# Patient Record
Sex: Male | Born: 1960 | Race: White | Marital: Married | Smoking: Never smoker
Health system: Northeastern US, Academic
[De-identification: ages and names within clinical notes are randomized; demographics above are authoritative.]

## PROBLEM LIST (undated history)

## (undated) DIAGNOSIS — J189 Pneumonia, unspecified organism: Secondary | ICD-10-CM

## (undated) DIAGNOSIS — E785 Hyperlipidemia, unspecified: Secondary | ICD-10-CM

## (undated) DIAGNOSIS — S82899A Other fracture of unspecified lower leg, initial encounter for closed fracture: Secondary | ICD-10-CM

## (undated) HISTORY — PX: HX TONSILLECTOMY/ADENOIDECTOMY: SHX292

## (undated) HISTORY — DX: Hyperlipidemia, unspecified: E78.5

## (undated) HISTORY — DX: Other fracture of unspecified lower leg, initial encounter for closed fracture: S82.899A

## (undated) HISTORY — DX: Pneumonia, unspecified organism: J18.9

---

## 2006-02-27 DIAGNOSIS — G43909 Migraine, unspecified, not intractable, without status migrainosus: Secondary | ICD-10-CM | POA: Insufficient documentation

## 2006-08-05 DIAGNOSIS — I781 Nevus, non-neoplastic: Secondary | ICD-10-CM | POA: Insufficient documentation

## 2006-08-05 DIAGNOSIS — I10 Essential (primary) hypertension: Secondary | ICD-10-CM | POA: Insufficient documentation

## 2009-08-11 ENCOUNTER — Ambulatory Visit
Admit: 2009-08-11 | Discharge: 2009-08-11 | Disposition: A | Discharge status: Home / Self Care | Payer: Self-pay | Source: Ambulatory Visit | Attending: Primary Care | Admitting: Primary Care

## 2009-09-06 ENCOUNTER — Ambulatory Visit: Payer: Self-pay | Admitting: Sports Medicine

## 2009-09-07 ENCOUNTER — Other Ambulatory Visit: Payer: Self-pay | Admitting: Sports Medicine

## 2009-09-20 ENCOUNTER — Ambulatory Visit: Payer: Self-pay | Admitting: Sports Medicine

## 2009-09-20 DIAGNOSIS — S83289A Other tear of lateral meniscus, current injury, unspecified knee, initial encounter: Secondary | ICD-10-CM | POA: Insufficient documentation

## 2009-09-27 NOTE — Progress Notes (Signed)
 HISTORY OF PRESENT ILLNESS:  Mendell is seen in follow-up for examination of  right knee and to review MRI findings.  Describes primarily a  posterior-based pain slightly lateral to midline and pain is present with  walking and present after standing after a position for a long time.    PHYSICAL EXAMINATION:  Examination demonstrates pain is present with deep  flexion, posterior based.  He has discomfort with circumduction testing,  again posterior based pain.  No appreciable effusion.  Ligamentously  stable.    IMAGING:  MRI demonstrates that there is a small lateral meniscus root tear  and a possible medial meniscus tear posteriorly as well.  No obvious  arthrosis.    ASSESSMENT:  Possible small meniscus tear.  The radiologic interpretation  has been negative for meniscus tear.  He does have what appears to be  slight irregularity certainly at the posterior lateral root attachment.  He  has not yet failed conservative management, however, and I do not think  arthroscopy would be appropriate as a first-line treatment.  We discussed  the options including a stability program with strengthening of quadriceps  and hamstrings.  He will work on range of motion and flexibility.  His  desire is to potentially progress back to a running type of program.  We  will work toward that goal.  In an effort to allow him to participate more  aggressively at therapy, I have discussed corticosteroid injection.  He  agrees to that, and after appropriate sterile prep, a combination of 80 mg  of Depo-Medrol with 3 mL of 1% lidocaine are instilled into the knee  without difficulty.  Follow up in 6 to 8 weeks.  He will work with Therapy  in the interim.                Electronically Signed and Finalized  by  Verita Schneiders, MD 09/27/2009 07:07  ___________________________________________  Verita Schneiders, MD      DD:   09/21/2009  DT:   09/21/2009  4:12 P  ZOX/WR#[id number]  [id number]    cc:

## 2009-11-16 ENCOUNTER — Ambulatory Visit: Payer: Self-pay | Admitting: Sports Medicine

## 2009-11-19 NOTE — Progress Notes (Signed)
 [name] [name] is seen in follow-up for reexamination of his right knee.  MRI  demonstrates a small lateral meniscus root tear and possible medial  meniscus posterior tear as well.  Recent corticosteroid injection, and he  is working in therapy.  Overall does demonstrate improvement.  Not  currently interested in surgery.    PHYSICAL EXAMINATION:  Examination demonstrates range is 0 to 130.  He does  have posterior based pain on deep flexion and circumduction, and this is  primarily a lateral posterior pain.  Ligamentously intact.    ASSESSMENT:  Lateral meniscus root ear, possible medial meniscus tear.  Improved with current intervention with steroid and therapy.  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.  If symptoms return or worsen,  suggest repeat examination in the office.  Could certainly consider an  arthroscopy.  I will await to hear back from [name].                Electronically Signed and Finalized  by  [name] Schneiders, MD 11/19/2009 12:34  ___________________________________________  [name] Schneiders, MD      DD:   11/18/2009  DT:   11/18/2009 10:08 A  ZOX/WR#[id number]  [id number]    cc:

## 2009-11-29 ENCOUNTER — Encounter: Payer: Self-pay | Admitting: Gastroenterology

## 2009-11-29 ENCOUNTER — Ambulatory Visit: Payer: Self-pay | Admitting: Primary Care

## 2009-11-29 NOTE — Progress Notes (Signed)
 Reason For Visit   HTN.  HPI   HYPERTENSION MANAGEMENT: [name] [name] is here for hypertension follow-up.  SYMPTOMS:  episodic sharp left > right chest pains since last summer which   is less now.  No pain with treadmill/exercise bike  PATIENT DENIES: Headache,  Palpitations, Shortness of Breath   HABITS:   --Patient has been following a reduced sodium diet.  --He is not getting adequate exercise due to recent small right lateral and   possible medial meniscus tear but he recently did PT which helped.  He just   resumed treadmill at home  --Smoking: No.   --Caffeine Use:  Yes.   4-5 diet Pepsi/d  HOME BLOOD PRESSURE: Does not take.    MEDICATIONS: None      ** Medication reconciliation completed and updated.  Allergies   No Known Drug Allergy.  Current Meds   None.  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  Vital Signs   Recorded by [facility] on 29 Nov 2009 07:58 AM  BP:122/82,  LUE,  Sitting,   HR: 68 b/min,  L Radial,   Weight: 213.4 lb.  Physical Exam   HEART: Regular rate and rhythm, no murmurs gallops or rubs.  LUNGS: Clear to auscultation bilaterally.     Assessment/Plan:     1.  HTN: Presently stable on no medicine.  Continue diet and exercise     RETURN: 4 months HTN.  Signature   Electronically signed by: Jasper Loser  M.D.; 11/29/2009 8:25 AM EST.

## 2010-03-21 ENCOUNTER — Ambulatory Visit: Payer: Self-pay | Admitting: Primary Care

## 2010-03-21 NOTE — Progress Notes (Signed)
Reason For Visit   HTN.  HPI   HYPERTENSION MANAGEMENT: [name] [name] is here for hypertension follow-up.  SYMPTOMS: Chest pain-occasional chronic sharp on right side-no trigger.  It   resolves on its own    PATIENT DENIES: Headache,   Palpitations, Shortness of Breath   HABITS:   --Patient has been following a reduced sodium diet.  --He is getting adequate exercise-walks 3-4x a week  --Smoking: No.   --Caffeine Use:  Yes.   4-5 diet Pepsi/d  HOME BLOOD PRESSURE: Does not take.    MEDICATIONS: None      ** Medication reconciliation completed and updated.  Allergies   No Known Drug Allergy.  Current Meds   None.  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  Vital Signs   Recorded by [facility] on 21 Mar 2010 07:54 AM  BP:116/82,  LUE,  Sitting,   HR: 70 b/min,  L Radial, Regular,   Weight: 206.6 lb.  Physical Exam   HEART: Regular rate and rhythm, no murmurs gallops or rubs.  LUNGS: Clear to auscultation bilaterally.  CHEST:  No palpable tenderness  .  Assessment   Assessment/Plan:     1. HYPERTENSION  --According to JNC 7 guidelines target BP: less than 140/90 patient   currently is at goal  Plan to reach goal includes:  --Lifestyle Modifications; discussed dietary sodium reduction; discussed   aerobic physical activity    --Medication Management: Currently on no medicine   2.  Chest pain: Atypical.  Suspect is costochondritis.  He will try to keep   a diary of triggering events.  Aleve as needed.  Call if no better/worse     RETURN: 4 months HTN  .  Signature   Electronically signed by: Jasper Loser  M.D.; 03/21/2010 8:29 AM EST.

## 2010-07-23 ENCOUNTER — Ambulatory Visit: Payer: Self-pay | Admitting: Primary Care

## 2010-07-23 NOTE — Progress Notes (Signed)
 Reason For Visit   HTN.  HPI   HYPERTENSION MANAGEMENT: [name] [name] is here for hypertension follow-up.  SYMPTOMS: Headache-rare and mild.  No migraines    PATIENT DENIES: Chest pain,  Palpitations, Shortness of Breath   HABITS:   --Patient has been following a reduced sodium diet.  --He is not getting adequate exercise.  --Smoking: No.   --Caffeine Use:  Yes.   5 diet Pepsi/d  HOME BLOOD PRESSURE: Does not take.    MEDICATIONS: None   Gained 11 lbs since last visit     ** Medication reconciliation completed and updated.  Allergies   No Known Drug Allergy.  Current Meds   None.  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  Vital Signs   Recorded by ROSA,JODIE on 23 Jul 2010 07:50 AM  BP:120/80,  LUE,  Sitting,   HR: 68 b/min,  L Radial, Regular,   Weight: 217 lb.  Physical Exam   HEART: Regular rate and rhythm, no murmurs gallops or rubs.  LUNGS: Clear to auscultation bilaterally.     Assessment/Plan:     1. HTN: Well-controlled with no medicine.  Currently at goal of less than   140/90.  Diet and exercise reviewed-patient will try to resume exercising   since he did gain 11 pounds since the last visit.  2.  Health maintenance: Flu shot given     RETURN: 4 months HTN.  Signature   Electronically signed by: Jasper Loser  M.D.; 07/23/2010 8:02 AM EST.

## 2010-09-24 ENCOUNTER — Ambulatory Visit: Payer: Self-pay

## 2010-10-30 NOTE — Miscellaneous (Unsigned)
 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

## 2010-11-30 ENCOUNTER — Encounter: Payer: Self-pay | Admitting: Primary Care

## 2010-11-30 ENCOUNTER — Ambulatory Visit: Payer: Self-pay | Admitting: Primary Care

## 2010-11-30 NOTE — Progress Notes (Signed)
 Reason For Visit   HTN.  HPI   HYPERTENSION MANAGEMENT: [name] [name] is here for hypertension follow-up.  SYMPTOMS: None    PATIENT DENIES: Headache, Chest pain,  Palpitations, Shortness of Breath   HABITS:   --Patient has been following a reduced sodium diet.  --He is not getting adequate exercise but plans on starting walks.  --Smoking: No.   --Caffeine Use:  Yes.   4-5 diet Pepsi a day  HOME BLOOD PRESSURE: Does not take.    MEDICATIONS: None   Gained 12 lbs since 03/2010      ** Medication reconciliation completed and updated.  Allergies   No Known Drug Allergy.  Current Meds   None.  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  Vital Signs   Recorded by ROSA,JODIE on 30 Nov 2010 11:52 AM  BP:136/86,  LUE,  Sitting,   HR: 68 b/min,  L Radial, Regular,   Weight: 219 lb.  Physical Exam   HEART: Regular rate and rhythm, no murmurs gallops or rubs.  LUNGS: Clear to auscultation bilaterally.     Assessment/Plan:     1.HTN: Borderline today but currently at goal of less than 140/90.  Patient   had a stressful morning at work and was rushing to get here.  He currently   is on no medicine.  He also has gained 13 pounds back since June.  He plans   on going back on his diet and increasing his exercise.     RETURN: 4 months HTN.  Signature   Electronically signed by: Jasper Loser  M.D.; 11/30/2010 12:13 PM   EST.

## 2011-03-29 ENCOUNTER — Ambulatory Visit: Payer: Self-pay | Admitting: Primary Care

## 2011-04-07 ENCOUNTER — Encounter: Payer: Self-pay | Admitting: Primary Care

## 2011-04-09 ENCOUNTER — Encounter: Payer: Self-pay | Admitting: Primary Care

## 2011-04-09 ENCOUNTER — Ambulatory Visit: Payer: Self-pay | Admitting: Primary Care

## 2011-04-09 VITALS — BP 120/84 | HR 76 | Ht 73.0 in | Wt 223.2 lb

## 2011-04-09 DIAGNOSIS — N539 Unspecified male sexual dysfunction: Secondary | ICD-10-CM

## 2011-04-09 DIAGNOSIS — I10 Essential (primary) hypertension: Secondary | ICD-10-CM

## 2011-04-09 NOTE — Progress Notes (Signed)
HYPERTENSION MANAGEMENT:        PATIENT DENIES: HA/CP/SOB/palpitations    HABITS:   --Sodium- avoids  --Exercise- walks and re-joined [employer] and goes 2x week  --Smoking- no  --Caffeine Use- 5 diet Pepsi/d    HOME BLOOD PRESSURE: does not take      [name] [name] has had decreased libido x 6 months.  [name] [name] can get an erection with no problem.  [name] [name] works 11-12 hours a day and is busy with his 3 kids sports activities.  [name] [name] typically works while on vacation and has not taken a major vacation in a few years.  His company recently got bought out and [name] [name] is very busy.     EXAM:    HEART: Regular rate and rhythm, no murmurs gallops or rubs.  LUNGS: Clear to auscultation bilaterally.    Assessment/Plan:    1.  HTN: Presently stable and well controlled on no medicine.  Continue diet and exercise.  [name] [name] is currently at goal of less than 140/90  2.  Sexual dysfunction: Suspect his decreased libido is from working long hours/busy home life.  We did discuss ways to improve his free time.  [name] [name] will think about restructuring his hours at work as well as taking strategic vacation days.  Consider checking testosterone level if no better/worse.  [name] [name] will call me if not better/worse    RETURN: 4 months HTN

## 2011-08-05 ENCOUNTER — Encounter: Payer: Self-pay | Admitting: Primary Care

## 2011-08-05 ENCOUNTER — Ambulatory Visit: Payer: Self-pay | Admitting: Primary Care

## 2011-08-05 ENCOUNTER — Other Ambulatory Visit: Payer: Self-pay | Admitting: Primary Care

## 2011-08-05 VITALS — BP 120/84 | HR 72 | Ht 73.0 in | Wt 224.4 lb

## 2011-08-05 DIAGNOSIS — R070 Pain in throat: Secondary | ICD-10-CM

## 2011-08-05 DIAGNOSIS — Z23 Encounter for immunization: Secondary | ICD-10-CM

## 2011-08-05 DIAGNOSIS — I10 Essential (primary) hypertension: Secondary | ICD-10-CM

## 2011-08-05 NOTE — Progress Notes (Signed)
Chief Complaint   Patient presents with   . Hypertension    HYPERTENSION MANAGEMENT:     He left his job 3 weeks ago after his company was taken over.  He is looking for a job.     BP 120/84  Pulse 72  Ht 1.854 m (6\' 1" )  Wt 101.787 kg (224 lb 6.4 oz)  BMI 29.61 kg/m2        PATIENT DENIES: HA/CP/SOB/palpitations    HABITS:   --Sodium- none  --Exercise- 3-4x week [employer]  --Smoking- no  --Caffeine Use- 1 diet Pepsi/d    HOME BLOOD PRESSURE: not taking    3-4 weeks ago, he had a sensation something was in his throat.  It went away on its own after a few days. It has been on and off since.  No heartburn.  No f/c/URI/rhinitis/recent swallowing of any foreign bodies.         EXAM:    AFFECT:  Pleasant and cheerful in NAD  THROAT:  Normal  NECK:  No adenopathy/masses noted  HEART: Regular rate and rhythm, no murmurs gallops or rubs.  LUNGS: Clear to auscultation bilaterally.    Assessment/Plan:    1.  HTN: Presently stable on no medicines.  Currently at goal of less than 140/90.  Diet and exercise reviewed.  Patient's stress level has significantly decreased since leaving his job.  2.  Throat discomfort: Possible globus hystericus since it is temporally related to leaving his job recently.  He also may have occult reflux.  Patient will try Prilosec 20 mg daily x2 weeks.  Will also have him see ENT to rule out any pathologic causes.  Call if no better/worse  3.  Health maintenance: Flu shot given    RETURN: 3 months HTN

## 2011-08-07 ENCOUNTER — Ambulatory Visit: Payer: Self-pay | Admitting: Otolaryngology

## 2011-08-07 ENCOUNTER — Encounter: Payer: Self-pay | Admitting: Otolaryngology

## 2011-08-07 VITALS — BP 122/78 | HR 71 | Ht 73.0 in | Wt 222.0 lb

## 2011-08-07 DIAGNOSIS — R0989 Other specified symptoms and signs involving the circulatory and respiratory systems: Secondary | ICD-10-CM

## 2011-08-07 NOTE — Progress Notes (Signed)
CC: Throat discomfort.  HPI 50 year old male states that his throat feels constricted and he has a sense of a lump in his throat.  He states that he feels something stuck in his throat. He has mild dysphagia.  This initially began 4 weeks ago, improved, and then recurred 2 weeks ago there has been no change in his voice.  He has mild dyspepsia occasionally but no pyrosis.  He currently is on Prilosec but does not have active symptoms of reflux.  He is a nonsmoker.The remainder of the past medical history, social history, family history, and review of systems is noncontributory.  PHYSICAL EXAM    GENERAL: Appears well, normal development. NAD. Color good.    HEAD/FACE: The haed and face reveal normal facial symmetry without lesions or scars. The salivary glands are palpated and appear normal without tenderness. There is normal facial nerve strength and symmetry. Extraocular motion is full without restriction of gaze.    EARS: Both pinna are normal in appearance with no scars, lesions or masses. The ear canals and both tympanic membranes are intact without inflammation.    NOSE: The nasal mucosa is not inflamed and no polyps are visualized. The nasal septum shows only minior, non-obstructed deviation and the inferior turbinates are normal without masses or obstructions. The paranasal sinuses are nontender. There is no evidence of septal perforation.    ORAL CAVITY: The buccal and oral mucosa are without lesions and the mouth an orophyarynx are normal in appearance without lesions or inflammation. The lips, teeth and tongue appear normal.    NECK: There are no evidenct masses. There is no asyummetry nor is there any cervical lymphadenopathy noted. The trachea is midline and the thyroid gland reveals no enlargement, tenderness nor mass effect.    Hypopharynx and larynx: Examined endoscopically.  There are no obstructing lesions.  There is no evidence of malignant or inflammatory disease.  Assessment.  Globus  pharyngeus.  Plan: Reflux precautions were reviewed.  He was encouraged to return for any new or progressive problems.The medication list was reviewed with the patient/family and a copy was provided.

## 2011-11-11 ENCOUNTER — Encounter: Payer: Self-pay | Admitting: Primary Care

## 2011-11-11 ENCOUNTER — Ambulatory Visit: Payer: Self-pay | Admitting: Primary Care

## 2011-11-11 VITALS — BP 130/90 | HR 76 | Ht 73.0 in | Wt 218.0 lb

## 2011-11-11 DIAGNOSIS — R51 Headache: Secondary | ICD-10-CM

## 2011-11-11 DIAGNOSIS — R42 Dizziness and giddiness: Secondary | ICD-10-CM

## 2011-11-11 DIAGNOSIS — N529 Male erectile dysfunction, unspecified: Secondary | ICD-10-CM

## 2011-11-11 DIAGNOSIS — I10 Essential (primary) hypertension: Secondary | ICD-10-CM

## 2012-02-10 ENCOUNTER — Ambulatory Visit: Payer: Self-pay | Admitting: Primary Care

## 2012-02-28 ENCOUNTER — Ambulatory Visit: Payer: Self-pay | Admitting: Primary Care

## 2012-02-28 ENCOUNTER — Encounter: Payer: Self-pay | Admitting: Primary Care

## 2012-02-28 VITALS — BP 120/82 | HR 76 | Ht 73.0 in | Wt 216.0 lb

## 2012-02-28 DIAGNOSIS — I10 Essential (primary) hypertension: Secondary | ICD-10-CM

## 2012-02-28 NOTE — Progress Notes (Signed)
Chief Complaint   Patient presents with   . Hypertension      HYPERTENSION MANAGEMENT:     BP 120/82  Pulse 76  Ht 1.854 m (6\' 1" )  Wt 97.977 kg (216 lb)  BMI 28.5 kg/m2        PATIENT DENIES: [x]  Headaches. [x]  Chest Pain. [x]  Palpitations. [x]  Shortness of Breath.    HABITS:   --Sodium-   [] Yes  [x] No          --Exercise-  [] Yes  [x] No due to starting a new job 3 months ago          --Smoking-  [] Yes  [x] No          --Caffeine Use- [x] Yes 1-2 diet colas a day  [] No             HOME BLOOD PRESSURE: not taking    EXAM:    HEART:  RRR, no m/g/r  LUNGS:  Clear    Assessment/Plan:    1.  HTN: Excellent control on no medicine.  He is currently at goal.  Diet and exercise reviewed    RETURN: 3 months HTN  & 10/2012 CME

## 2012-04-06 ENCOUNTER — Encounter: Payer: Self-pay | Admitting: Gastroenterology

## 2012-06-19 ENCOUNTER — Encounter: Payer: Self-pay | Admitting: Primary Care

## 2012-06-19 ENCOUNTER — Ambulatory Visit: Payer: Self-pay | Admitting: Primary Care

## 2012-06-19 VITALS — BP 120/86 | HR 72 | Ht 73.0 in | Wt 220.0 lb

## 2012-06-19 DIAGNOSIS — M47812 Spondylosis without myelopathy or radiculopathy, cervical region: Secondary | ICD-10-CM

## 2012-06-19 DIAGNOSIS — M546 Pain in thoracic spine: Secondary | ICD-10-CM

## 2012-06-19 DIAGNOSIS — I10 Essential (primary) hypertension: Secondary | ICD-10-CM

## 2012-06-19 DIAGNOSIS — G43909 Migraine, unspecified, not intractable, without status migrainosus: Secondary | ICD-10-CM

## 2012-06-19 NOTE — Progress Notes (Signed)
HYPERTENSION MANAGEMENT:     BP 138/94  Pulse 72  Ht 1.854 m (6\' 1" )  Wt 99.791 kg (220 lb)  BMI 29.03 kg/m2   Filed Vitals:    06/19/12 0754   BP: 120/86   Pulse:    Height:    Weight:            PATIENT DENIES: [x]  Headaches. [x]  Chest Pain. [x]  Palpitations. [x]  Shortness of Breath.    HABITS:   --Sodium-   [] Yes  [x] No          --Exercise-  [x] Yes less due to work travel (2x week)  [] No          --Smoking-  [] Yes  [x] No          --Caffeine Use- [x] Yes 2 diet Pepsi/d  [] No             HOME BLOOD PRESSURE: not taking    He has had right medial scapula pain x3-4 days. Pain is dull and it hurts to turn neck and upon awakening.  No tingling/numbness/weakness/rash. Denies any migraines in 1 year.  Been using Motirn sporadically with mild relief.  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     Medications reviewed and changes made      No current outpatient prescriptions on file.     No current facility-administered medications for this visit.        EXAM:    NECK: No palpable tenderness.  Full range of motion except when turning his neck to the left..  No radicular symptoms with range of motion.  BACK:  Palpable tenderness medial right scapula.  No rash.  HEART: Regular rate and rhythm, no murmurs gallops or rubs.  LUNGS: Clear to auscultation bilaterally.    Assessment/Plan:    1.  HTN: Presently well controlled on no medicine.  He is currently at goal of less than 140/90.  Diet and exercise reviewed  2.  Right medial scapular pain:  Possible rhomboid spasm, referred pain from his neck.  I did review with him some stretches to try.  Okay to use Aleve one tablet twice a day with food x1 week if needed.  Heat as needed.  Icy Hot as needed.  Massage.  Proper posture reviewed.  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  Call if not better/worse  3.  Migraine headaches: Asymptomatic for approximately one year.  Patient is not sure of what his triggers are.  A  handout was given to possible triggers to monitor for    RETURN: 10/2012 CME

## 2012-10-26 ENCOUNTER — Encounter: Payer: Self-pay | Admitting: Primary Care

## 2012-10-26 ENCOUNTER — Ambulatory Visit: Payer: Self-pay | Admitting: Primary Care

## 2012-11-20 ENCOUNTER — Other Ambulatory Visit: Payer: Self-pay | Admitting: Primary Care

## 2012-11-20 ENCOUNTER — Ambulatory Visit: Payer: Self-pay | Admitting: Primary Care

## 2012-11-20 ENCOUNTER — Encounter: Payer: Self-pay | Admitting: Primary Care

## 2012-11-20 VITALS — BP 124/80 | Ht 73.0 in | Wt 221.1 lb

## 2012-11-20 DIAGNOSIS — I10 Essential (primary) hypertension: Secondary | ICD-10-CM

## 2012-11-20 DIAGNOSIS — Z1211 Encounter for screening for malignant neoplasm of colon: Secondary | ICD-10-CM

## 2012-11-20 DIAGNOSIS — G2581 Restless legs syndrome: Secondary | ICD-10-CM

## 2012-11-20 MED ORDER — ROPINIROLE HCL 0.25 MG PO TABS *I*
ORAL_TABLET | ORAL | Status: DC
Start: 2012-11-20 — End: 2013-04-14

## 2012-11-20 NOTE — Progress Notes (Signed)
Chief Complaint   Patient presents with   . Hypertension     HYPERTENSION MANAGEMENT:     BP 134/82  Ht 1.854 m (6\' 1" )  Wt 100.272 kg (221 lb 1 oz)  BMI 29.17 kg/m2   Filed Vitals:    11/20/12 0747   BP: 124/80   Height:    Weight:       Patient's nephew was recently killed in an MVA 3 days before Christmas.  It is his sister's son.  Patient's mother is being treated for lung cancer as well.  Patient is coping with the stressors     PATIENT DENIES: [x]  Headaches. [x]  Chest Pain. [x]  Palpitations. [x]  Shortness of Breath.    HABITS:   --Sodium-   [] Yes  [x] No          --Exercise-  [x] Yes 1x week  [] No          --Smoking-  [] Yes  [x] No          --Caffeine Use- [x] Yes 2-3 diet Pepsi/d  [] No             HOME BLOOD PRESSURE: not taking    His wife tells him he kicks his legs in his sleep.  It awakens her.  He snores but has not been told he has apnea.    EXAM:      HEART:  RRR, no m/g/r  LUNGS:  Clear    ASSESSMENT/PLAN:    1.  ZOX:[id number] well-controlled and at goal of less than 140/90.  Diet and exercise reviewed.  He was advised to call me if he has difficulty coping with his nephew's recent death as well as his mother lung cancer  2.  Probable restless leg syndrome: Patient wants to try Requip 0.25 mg one to 3 hours before bedtime.  He will call me with an update.  Consider checking an iron level.  3.  Health maintenance: Flu shot given.  Patient will turn 78 in May.  I gave him the name of Dr. Raleigh Callas to call to schedule a screening colonoscopy.  He also will need a PSA at 52     RETURN: 02/2013 HTN  & 05/2013 CME

## 2012-11-27 ENCOUNTER — Ambulatory Visit: Payer: Self-pay | Admitting: Primary Care

## 2013-01-06 ENCOUNTER — Encounter: Payer: Self-pay | Admitting: Gastroenterology

## 2013-01-06 ENCOUNTER — Ambulatory Visit: Payer: Self-pay | Admitting: Primary Care

## 2013-01-06 ENCOUNTER — Encounter: Payer: Self-pay | Admitting: Primary Care

## 2013-01-06 VITALS — BP 128/86 | HR 68 | Ht 72.0 in | Wt 225.8 lb

## 2013-01-06 DIAGNOSIS — I10 Essential (primary) hypertension: Secondary | ICD-10-CM

## 2013-01-06 DIAGNOSIS — G43909 Migraine, unspecified, not intractable, without status migrainosus: Secondary | ICD-10-CM

## 2013-01-06 DIAGNOSIS — Z0000 Encounter for general adult medical examination without abnormal findings: Secondary | ICD-10-CM

## 2013-01-06 DIAGNOSIS — G2581 Restless legs syndrome: Secondary | ICD-10-CM | POA: Insufficient documentation

## 2013-01-06 NOTE — H&P (Signed)
History and Physical    HISTORY:  Chief Complaint   Patient presents with   . Annual Exam         History of Present Illness:    HPI Comments: He is under a lot of stress with mother having lung cancer.  He was given Requip for possible restless leg syndrome.  He uses it 4-5x week and wife tells him he is a little better.  He is unaware of any leg movements.  He snores but has not been told he has apnea.       Problems:  Patient Active Problem List   Diagnosis Code   . Migraine Headache 346.90   . Hypertension 401.9   . Moles 448.1   . Probable Right Lateral Meniscal Tear 836.1   . Osteoarthritis of Cervical spine -mild 721.0        Past Medical/Surgical History:   Past Medical History   Diagnosis Date   . Pneumonia      Conversion Data - Jenna Luo   . Unspecified closed fracture of ankle      Conversion Data - ^Resolved   . Hyperlipidemia      Conversion Data - Jenna Luo     Past Surgical History   Procedure Laterality Date   . Hx tonsillectomy/adenoidectomy       Tonsillectomy Conversion Data          Allergies:  No Known Allergies (drug, envir, food or latex)    Current medications:    Current Outpatient Prescriptions   Medication Sig   . rOPINIRole (REQUIP) 0.25 MG tablet Take 1 Tablet 1-3 hours before bedtime       Family History:    Family History   Problem Relation Age of Onset   . Conversion Other      [id number]^VWUJWJ Health Status^^Active^Father(A)AODM Mother(A) 2 Brothers(A)HTN 1 Sister(A)Graves.Crohns  Osteochondroma 2 Sons(A)1 Allergies 1 Daughter(A) PatGF(D)57.MI PatGM(D)68.Pneumonia due to hip frx.COPD MatGF(D)60s.CVA MatGM(D)80s.unsure.   . Diabetes Father    . Hypertension Brother    . Hypertension Brother        Social/Occupational History:   History     Social History   . Marital Status: Married     Spouse Name: N/A     Number of Children: 3   . Years of Education: N/A     Occupational History   . CEO [employer]      Social History Main Topics   . Smoking status: Never  Smoker    . Smokeless tobacco: Never Used   . Alcohol Use: Yes      Comment: Social use   . Drug Use: No   . Sexually Active: Yes -- Male partner(s)     Other Topics Concern   . None     Social History Narrative   . None         Review of Systems:    Review of Systems   Constitutional: Negative for fever, chills, weight loss and malaise/fatigue.        Normal appetite.  No night sweats.   HENT: Positive for neck pain (occasional ). Negative for hearing loss, ear pain, nosebleeds, congestion, sore throat and tinnitus.         No rhinitis   Eyes: Negative for blurred vision, double vision, pain, discharge and redness.        Wears glasses   Respiratory: Negative for cough, hemoptysis, shortness of breath and wheezing.    Cardiovascular: Negative for chest pain,  palpitations, orthopnea and leg swelling.   Gastrointestinal: Negative for heartburn, nausea, vomiting, abdominal pain, diarrhea, constipation, blood in stool and melena.        No Dysphagia   Genitourinary: Negative for dysuria, urgency, frequency, hematuria and flank pain.        No Nocturia.  No penile sores/discharge.  No testicle pain/masses.  No hernias.  No erectile dysfunction.   Musculoskeletal: Negative for myalgias, back pain, joint pain and falls.   Skin: Negative for itching and rash.        No mole changes   Neurological: Positive for headaches (1 migraine every 2-3 months). Negative for dizziness, tingling, sensory change, focal weakness, loss of consciousness and weakness.   Endo/Heme/Allergies: Negative for polydipsia. Does not bruise/bleed easily.        No adenopathy.   Psychiatric/Behavioral: Negative for depression and memory loss. The patient is not nervous/anxious and does not have insomnia.    NUTRITION:     -Caffeine: 2-3 diet cola/d   -Salt: none   -Cholesterol: 0-2 eggs/week, rare fast food, more junk food the past few months, drinks skim milk    SAFETY:     -Seatbelt: yes   -Safe Sex: no affairs    SELF TESTICLE EXAM:  no    EXERCISE: walks 4x week    PROXY: Given         Vital Signs:   BP 128/86  Pulse 68  Ht 1.829 m (6')  Wt 102.422 kg (225 lb 12.8 oz)  BMI 30.62 kg/m2    EKG:  NSR 67.  Old slight inverted T  & deeper Q III compared to 08/11/09      PHYSICAL EXAM:  Physical Exam        Assessment:    Ovel was seen today for annual exam.    Diagnoses and associated orders for this visit:    Annual physical exam  - EKG 12 lead  - Cancel: EKG 12 lead; Future  - Comprehensive metabolic panel; Future  - Lipid panel; Future  - CBC and differential; Future  - PSA (eff.01-2009); Future  - Iron; Future  - Urinalysis with microscopic; Future    HTN (hypertension)  - Comprehensive metabolic panel; Future    Migraine headache  - Comprehensive metabolic panel; Future    Restless leg syndrome  - Comprehensive metabolic panel; Future  - Iron; Future         .      Plan:          Assessment/Plan:    1.  UEA:VWUJWJXBJ well-controlled and at goal of less than 140/90.  Diet and exercise reviewed.  He is currently on no medication.  I did recommend he try to lose some weight.  2.  Migraine headaches: Sporadic.  Consider low-dose beta blocker/calcium channel blocker if blood pressure is not controlled with diet and exercise  3.  DJD C-spine: Patient does have occasional neck discomfort.  No radicular symptoms.  We did discuss taking frequent breaks when using the computer.  4.  Possible restless leg syndrome: Low-dose Requip may be helping.  He will have his wife monitor him better.  Consider increasing the dose if worse.  Will check screening labs below.  5.  Health maintenance: Patient alternative be in May.  He is in the process of scheduling a screening colonoscopy with Dr. Raleigh Callas.  Will check a PSA  6.  Labs: SMAC/FLP/CBC with differential/iron/PSA/UA    RETURN: 3 months HTN

## 2013-01-13 ENCOUNTER — Ambulatory Visit
Admit: 2013-01-13 | Discharge: 2013-01-13 | Disposition: A | Discharge status: Home / Self Care | Payer: Self-pay | Source: Ambulatory Visit | Attending: Primary Care | Admitting: Primary Care

## 2013-01-13 DIAGNOSIS — Z0000 Encounter for general adult medical examination without abnormal findings: Secondary | ICD-10-CM

## 2013-01-13 DIAGNOSIS — G2581 Restless legs syndrome: Secondary | ICD-10-CM

## 2013-01-13 DIAGNOSIS — I10 Essential (primary) hypertension: Secondary | ICD-10-CM

## 2013-01-13 DIAGNOSIS — G43909 Migraine, unspecified, not intractable, without status migrainosus: Secondary | ICD-10-CM

## 2013-01-13 LAB — URINALYSIS WITH MICROSCOPIC
Blood,UA: NEGATIVE
Ketones, UA: NEGATIVE
Leuk Esterase,UA: NEGATIVE
Nitrite,UA: NEGATIVE
Protein,UA: NEGATIVE mg/dL
RBC,UA: 1 /hpf (ref 0–2)
Specific Gravity,UA: 1.02 (ref 1.002–1.030)
WBC,UA: <1 /hpf (ref 0–5)
pH,UA: 5 (ref 5.0–8.0)

## 2013-01-13 LAB — COMPREHENSIVE METABOLIC PANEL
ALT: 35 U/L (ref 0–50)
AST: 28 U/L (ref 0–50)
Albumin: 4.8 g/dL (ref 3.5–5.2)
Alk Phos: 64 U/L (ref 40–130)
Anion Gap: 11 (ref 7–16)
Bilirubin,Total: 0.6 mg/dL (ref 0.0–1.2)
CO2: 25 mmol/L (ref 20–28)
Calcium: 9.3 mg/dL (ref 8.6–10.2)
Chloride: 107 mmol/L (ref 96–108)
Creatinine: 1.15 mg/dL (ref 0.67–1.17)
GFR,Black: 85 *
GFR,Caucasian: 74 *
Glucose: 87 mg/dL (ref 60–99)
Lab: 19 mg/dL (ref 6–20)
Potassium: 4.4 mmol/L (ref 3.3–5.1)
Sodium: 143 mmol/L (ref 133–145)
Total Protein: 6.6 g/dL (ref 6.3–7.7)

## 2013-01-13 LAB — IRON: Iron: 121 ug/dL (ref 45–170)

## 2013-01-13 LAB — LIPID PANEL
Chol/HDL Ratio: 3.7
Cholesterol: 198 mg/dL
HDL: 54 mg/dL
LDL Calculated: 130 mg/dL
Non HDL Cholesterol: 144 mg/dL
Triglycerides: 70 mg/dL

## 2013-01-13 LAB — CBC AND DIFFERENTIAL
Baso # K/uL: 0.1 10*3/uL (ref 0.0–0.1)
Basophil %: 0.9 % (ref 0.2–1.2)
Eos # K/uL: 0.1 10*3/uL (ref 0.0–0.5)
Eosinophil %: 2.1 % (ref 0.8–7.0)
Hematocrit: 44 % (ref 40–51)
Hemoglobin: 15.8 g/dL (ref 13.7–17.5)
Lymph # K/uL: 1.8 10*3/uL (ref 1.3–3.6)
Lymphocyte %: 32.2 % (ref 21.8–53.1)
MCV: 91 fL (ref 79–92)
Mono # K/uL: 0.5 10*3/uL (ref 0.3–0.8)
Monocyte %: 9 % (ref 5.3–12.2)
Neut # K/uL: 3.2 10*3/uL (ref 1.8–5.4)
Platelets: 273 10*3/uL (ref 150–330)
RBC: 4.9 MIL/uL (ref 4.6–6.1)
RDW: 12 % (ref 11.6–14.4)
Seg Neut %: 55.8 % (ref 34.0–67.9)
WBC: 5.7 10*3/uL (ref 4.2–9.1)

## 2013-01-13 LAB — PSA (EFF.4-2010): PSA (eff. 4-2010): 0.26 ng/mL (ref 0.00–4.00)

## 2013-02-19 ENCOUNTER — Ambulatory Visit: Payer: Self-pay | Admitting: Primary Care

## 2013-04-14 ENCOUNTER — Ambulatory Visit: Payer: Self-pay | Admitting: Primary Care

## 2013-04-14 ENCOUNTER — Encounter: Payer: Self-pay | Admitting: Primary Care

## 2013-04-14 VITALS — BP 114/66 | HR 64 | Ht 72.0 in | Wt 225.8 lb

## 2013-04-14 NOTE — Progress Notes (Signed)
Chief Complaint   Patient presents with   . Hypertension      HYPERTENSION MANAGEMENT:     BP 114/66  Pulse 64  Ht 1.829 m (6')  Wt 102.422 kg (225 lb 12.8 oz)  BMI 30.62 kg/m2        PATIENT DENIES: [x]  Headaches. [x]  Chest Pain. [x]  Palpitations. [x]  Shortness of Breath.     HABITS:   --Sodium-   [x] Yes rare  [] No          --Exercise-  [x] Yes sporadic walks-mother has terminal lung cancer and he started a new job  [] No          --Smoking-  [] Yes  [x] No          --Caffeine Use- [x] Yes diet Pepsi  [] No             HOME BLOOD PRESSURE: not taking    EXAM:    HEART:  RRR, no m/g/r  LUNGS:  Clear    ASSESSMENT/PLAN:    1.  ZOX:WRUEAVWUJ well-controlled and at goal of less than 140/90.  Diet and exercise reviewed.  He is currently on no medicine  2.  Health maintenance: Patient turned 52 in May.  He does have the name of Dr. Raleigh Callas to call to schedule a screening colonoscopy.    RETURN: 3 months HTN

## 2013-06-11 ENCOUNTER — Encounter: Payer: Self-pay | Admitting: Primary Care

## 2013-08-04 ENCOUNTER — Encounter: Payer: Self-pay | Admitting: Primary Care

## 2013-08-04 ENCOUNTER — Ambulatory Visit: Payer: Self-pay | Admitting: Primary Care

## 2013-08-04 VITALS — BP 120/80 | HR 68 | Ht 72.0 in | Wt 232.0 lb

## 2013-08-04 DIAGNOSIS — Z23 Encounter for immunization: Secondary | ICD-10-CM

## 2013-08-04 DIAGNOSIS — I10 Essential (primary) hypertension: Secondary | ICD-10-CM

## 2013-08-04 NOTE — Progress Notes (Signed)
Chief Complaint   Patient presents with   . Hypertension      HYPERTENSION MANAGEMENT:     BP 120/80  Pulse 68  Ht 1.829 m (6')  Wt 105.235 kg (232 lb)  BMI 31.46 kg/m2     His mother passed away 05-21-13 from terminal lung cancer. He has been coping well.  He is doing well with his new job.      PATIENT DENIES: [x]  Headaches. [x]  Chest Pain. [x]  Palpitations. [x]  Shortness of Breath.    HABITS:   --Sodium-   [] Yes  [x] No          --Exercise-  [x] Yes 3x week walks for 30 minutes  [] No          --Smoking-  [] Yes  [x] No          --Caffeine Use- [x] Yes  [] No             HOME BLOOD PRESSURE: not taking    EXAM:    HEART: Regular rate and rhythm, no murmurs gallops or rubs.  LUNGS: Clear to auscultation bilaterally.      ASSESSMENT/PLAN:    1.  AVW:UJWJXBJYN well-controlled and at goal of less than 140/90.  Diet and exercise reviewed.  Currently on no medicines.  2.  Health maintenance: Flu shot given.  Patient was recommended to call Dr. Raleigh Callas for his screening colonoscopy.    RETURN: 4 months HTN/check PSA

## 2013-12-13 ENCOUNTER — Ambulatory Visit: Payer: Self-pay | Admitting: Primary Care

## 2013-12-13 ENCOUNTER — Encounter: Payer: Self-pay | Admitting: Primary Care

## 2013-12-13 VITALS — BP 124/84 | HR 76 | Ht 72.0 in | Wt 232.8 lb

## 2013-12-13 DIAGNOSIS — G43909 Migraine, unspecified, not intractable, without status migrainosus: Secondary | ICD-10-CM

## 2013-12-13 DIAGNOSIS — Z139 Encounter for screening, unspecified: Secondary | ICD-10-CM

## 2013-12-13 DIAGNOSIS — I10 Essential (primary) hypertension: Secondary | ICD-10-CM

## 2013-12-13 DIAGNOSIS — M722 Plantar fascial fibromatosis: Secondary | ICD-10-CM

## 2013-12-13 NOTE — Progress Notes (Signed)
Chief Complaint   Patient presents with    Hypertension     HYPERTENSION MANAGEMENT:     BP 124/84    Pulse 76    Ht 1.829 m (6')    Wt 105.597 kg (232 lb 12.8 oz)    BMI 31.57 kg/m2           PATIENT DENIES:  [x]  Chest Pain. [x]  Palpitations. [x]  Shortness of Breath.    SYMPTOMS:  had a "bad migraine" last week-first one in a year and not sure of the trigger. Excedrin Migraine and a hot shower helped. He took Glass blower/designeriroecet in his 30s but recalls it did not help a lot.     HABITS:   --Sodium-   [] Yes  [x] No          --Exercise-  [x] Yes [employer] 2x week  [] No          --Smoking-  [] Yes  [x] No          --Caffeine Use- [x] Yes 2 diet cola/d  [] No             HOME BLOOD PRESSURE: not taking    For the past year, both heels hurt episodically, especially after getting out of bed/prolonged standing.  He does not jog.  He tried some OTC inserts with mild relief. Took no meds.     EXAM:    HEART:  RRR, no m/g/r  LUNGS:  Clear  FEET:  Very minimal tenderness over both heels at the insertion of the plantar fascia    ASSESSMENT/PLAN:    1. HTN: Presently well-controlled and at goal of less than 140/90.  Diet and exercise reviewed.  Currently on no medicine  2.  Migraine headaches: He had a bad migraine last week.  This was the first one in a year.  He is not sure of the trigger, but it could be neck tension since a hot shower helped.  Excedrin migraine as needed.  He was advised to call me if migraines increase.  3.  Probable bilateral plantar fasciitis: Handout was given with stretches to try.  If no better/worse, consider podiatry consult.  4.  Health maintenance: Patient has a screening colonoscopy next week with Dr. Raleigh CallasSharma.  Will check a PSA.    RETURN: 3 months HTN

## 2013-12-23 ENCOUNTER — Other Ambulatory Visit: Payer: Self-pay | Admitting: Gastroenterology

## 2013-12-23 ENCOUNTER — Encounter: Payer: Self-pay | Admitting: Gastroenterology

## 2013-12-23 LAB — HM COLONOSCOPY

## 2013-12-23 NOTE — Procedures (Signed)
[facility]  Colonoscopy Procedure Note     Date of Procedure: 12/23/2013   Referring Physician: Cheri FowlerPietropaoli, Robert C, MD   Primary Physician: Cheri FowlerPietropaoli, Robert C, MD  Attending Physician: Cline CoolsAnil K Lekeisha Arenas MD  Indications:   Colorectal cancer screening-average risk  Medications:Fentanyl 100 mcg IV and Midazolam 5 mg IV were administered incrementally over the course of the procedure to achieve an adequate level of conscious sedation.     Procedure Details: The patient was placed in the left lateral decubitus position and monitored continuously with ECG tracing, pulse oximetry, blood pressure monitoring and direct observations. After anorectal examination was performed, the Olympus Video Colonoscope was inserted into the rectum and advanced under direct vision to the terminal ileum. The procedure was considered not difficult..    Narrative:  During withdrawal examination, the final quality of the prep was good  A careful inspection was made as the colonoscope was withdrawn, a retroflexed view of the rectum was included; findings and interventions are described below.The patient recovered satisfactorily  in the [facility] recovery area.      The cecum, terminal ileum, ascending colon, transverse colon, descending colon, rectosigmoid revealed no evidence of mass, polyp or inflammatory disease.    Additional Interventions:   None    Complications: none    Impression:   Normal colonoscopy with no evidence of mass, polyp or inflammatory changes    Recommendations:   Recommend stool guaiac 3 year(s)  For colon cancer screening in this average-risk patient, colonoscopy may be repeated in 10 years    Lisbeth PlyANIL Stefanie Hodgens, MD

## 2014-03-16 ENCOUNTER — Ambulatory Visit: Payer: Self-pay | Admitting: Primary Care

## 2014-03-16 ENCOUNTER — Encounter: Payer: Self-pay | Admitting: Primary Care

## 2014-03-16 VITALS — BP 120/80 | HR 68 | Ht 72.0 in | Wt 234.0 lb

## 2014-03-16 DIAGNOSIS — E663 Overweight: Secondary | ICD-10-CM

## 2014-03-16 DIAGNOSIS — M7712 Lateral epicondylitis, left elbow: Secondary | ICD-10-CM

## 2014-03-16 DIAGNOSIS — I10 Essential (primary) hypertension: Secondary | ICD-10-CM

## 2014-03-16 DIAGNOSIS — Z139 Encounter for screening, unspecified: Secondary | ICD-10-CM

## 2014-03-16 LAB — PSA (EFF.4-2010): PSA (eff. 4-2010): 0.24 ng/mL (ref 0.00–4.00)

## 2014-03-16 NOTE — Addendum Note (Signed)
Addended by: Micah Flesher on: 03/16/2014 08:37 AM     Modules accepted: Orders

## 2014-03-16 NOTE — Progress Notes (Signed)
Chief Complaint   Patient presents with    Hypertension      HYPERTENSION MANAGEMENT:     BP 120/80    Pulse 68    Ht 1.829 m (6')    Wt 106.142 kg (234 lb)    BMI 31.73 kg/m2           PATIENT DENIES: [x]  Headaches. [x]  Chest Pain. [x]  Palpitations. [x]  Shortness of Breath.    He gained 9 lbs.  He has been snacking at night/junk food/nuts.    Left lateral elbow been sore for 3 weeks.  No injury.  Hurts to lift/pronate/supinate.  Took sporadic Aleve     HABITS:   --Sodium-   [] Yes  [x] No          --Exercise-  [x] Yes walks 1.5 miles 3 times a week  [] No          --Smoking-  [] Yes  [x] No          --Caffeine Use- [x] Yes 2 diet Pepsi/d  [] No             HOME BLOOD PRESSURE: not taking    EXAM:      LEFT ELBOW: Palpable tenderness over his lateral epicondyle.  No redness or swelling.  HEART:  RRR, no m/g/r  LUNGS:  Clear    ASSESSMENT/PLAN:    1.  HTN: Currently on no medicine and at goal of less than 140/90.  Diet and exercise extensively reviewed.  He needs to lose some weight.  He'll try to decrease his snacking.  Healthy foods choices as well as portion control reviewed.  2.  Left tennis elbow: Avoid triggering activities.  Okay to use Aleve twice a day with food for one week.  Okay to try a tennis elbow strap.  If no better, he will call Dr. Janyth Contes who he has seen in the past.  3.  Health maintenance: Patient never did his PSA.  He will go today to do it.    RETURN: 3 months HTN/overweight

## 2014-04-12 ENCOUNTER — Other Ambulatory Visit: Payer: Self-pay | Admitting: Orthopedic Surgery

## 2014-04-12 DIAGNOSIS — M25529 Pain in unspecified elbow: Secondary | ICD-10-CM

## 2014-04-13 ENCOUNTER — Ambulatory Visit: Payer: Self-pay | Admitting: Sports Medicine

## 2014-04-13 ENCOUNTER — Encounter: Payer: Self-pay | Admitting: Sports Medicine

## 2014-04-13 VITALS — BP 136/86 | Ht 73.0 in | Wt 230.0 lb

## 2014-04-13 DIAGNOSIS — M7710 Lateral epicondylitis, unspecified elbow: Secondary | ICD-10-CM

## 2014-04-13 HISTORY — DX: Lateral epicondylitis, unspecified elbow: M77.10

## 2014-04-13 NOTE — Patient Instructions (Signed)
\  Dear Hinton Dyerolin Louk,    Your physician has determined that you require durable medical equipment (DME) as a part of your treatment.  Knee braces, cast boots, walking boots, crutches, etc. Are DME.  These items offer protection and provide for your safety.  The type and quality of DME has been prescribed for you by your provider.      We cannot determine how much of the cost of this product will be paid by your insurance carrier.  Therefore, you may receive a bill for the outstanding balance.  It is your responsibility to pay whatever fee your insurance carrier does not.    DME Return Policy:     Braces and boots are not returnable if work outside of clinic due to hygiene concerns.   Poorly fitting braces can be exchanged for a correct fit within 1 week if the DME is in excellent condition.   DME that was not dispensed by [facility] will not be accepted.    If DME must be returned, it must be returned to the office that dispensed it.   Special order braces are billed at the time of order and are non-refundable.   Brace parts can be ordered and replaced if they become damaged or worn out.  This may include a charge.    Your type of brace: Other - Large/left 7 inch lacer wrist splint and a tennis elbow    Patient Signature: __________________________  04/13/2014

## 2014-04-14 NOTE — Progress Notes (Signed)
[name] [name]:   [name] [name]  MR #:  [id number]   ACCOUNT #:  [id number] DOB:  1961-10-18   DICTATED BY:  [name] SchneidersJohn P Ramzi Brathwaite, [name] DATE OF VISIT:  04/13/2014     [name] RumpfColin returns with a new complaint.  [name] [name] now has experienced a persistence of a lateral based elbow pain since early May.  No trauma.  No treatments.  [name] [name] states that there are days where [name] [name] has no trouble and alternate days where [name] [name] has a sharp lateral based pain.  [name] [name] would like to discuss.  Past medical history, medications and allergies, as documented in the office chart and marked as reviewed.    SOCIAL HISTORY:  Married, denies smoking, occasionally drinks alcohol.    FAMILY HISTORY:  Lung cancer.    REVIEW OF SYSTEMS:  Documented in today's intake questionnaire, unremarkable for recent illness.    PHYSICAL EXAMINATION:  GENERAL:  A very pleasant, 53 year old individual, who sits in no apparent distress.  NECK:  Supple motion in the neck in all planes.  SKIN:  No evidence of external skin lesions bilateral upper extremities.  EXTREMITIES:  Focused exam of the left upper extremity demonstrates shoulder, wrist and finger range of motion are without pain, other than with wrist extension and finger extension against resistance, [name] [name] has an elbow pain.  [name] [name] is tender over the lateral epicondyle.  Pain again worse with wrist extension and finger extension as well as supination.  [name] [name] has no lateral swelling and [name] [name] has no medial based pain.  Range of motion is full.    ASSESSMENT:  Left elbow lateral epicondylitis.  Persistent trouble for months.  We discussed options for treatment.  This includes discussion regarding a corticosteroid injection, tennis elbow strap, nighttime splinting, and I also demonstrated multiple exercises.  [name] [name] consents to the injection and after appropriate sterile prep, a single injection of 40 mg Depo-Medrol and 1.5 mL of 1% lidocaine are instilled at the point of maximum tenderness.  [name] [name] will trial the bracing and if his symptoms persist, [name] [name] will  let me know.             ______________________________  [name] SchneidersJohn P [name], [name]    JPG/MODL  DD:  04/13/2014 18:08:19  DT:  04/14/2014 09:56:02  Job #:  [id number]    cc: [name] SchneidersJohn P [name] Brau, [name]

## 2014-06-17 ENCOUNTER — Ambulatory Visit: Payer: Self-pay | Admitting: Primary Care

## 2014-06-19 ENCOUNTER — Emergency Department
Admission: EM | Admit: 2014-06-19 | Disposition: A | Discharge status: Home / Self Care | Payer: Self-pay | Source: Ambulatory Visit | Attending: Emergency Medicine | Admitting: Emergency Medicine

## 2014-06-19 ENCOUNTER — Encounter: Payer: Self-pay | Admitting: Emergency Medicine

## 2014-06-19 ENCOUNTER — Other Ambulatory Visit: Payer: Self-pay | Admitting: Gastroenterology

## 2014-06-19 LAB — CBC AND DIFFERENTIAL
Baso # K/uL: 0.1 10*3/uL (ref 0.0–0.1)
Basophil %: 0.8 %
Eos # K/uL: 0.1 10*3/uL (ref 0.0–0.5)
Eosinophil %: 1.9 %
Hematocrit: 43 % (ref 40–51)
Hemoglobin: 14.8 g/dL (ref 13.7–17.5)
IMM Granulocytes #: 0 % (ref 0.0–0.1)
IMM Granulocytes: 0.5 %
Lymph # K/uL: 1.5 10*3/uL (ref 1.3–3.6)
Lymphocyte %: 25.8 %
MCH: 32 pg/cell (ref 26–32)
MCHC: 35 g/dL (ref 32–37)
MCV: 93 fL — ABNORMAL HIGH (ref 79–92)
Mono # K/uL: 0.7 10*3/uL (ref 0.3–0.8)
Monocyte %: 12.6 %
Neut # K/uL: 3.4 10*3/uL (ref 1.8–5.4)
Nucl RBC # K/uL: 0 10*3/uL (ref 0.0–0.0)
Nucl RBC %: 0 /100 WBC (ref 0.0–0.2)
Platelets: 259 10*3/uL (ref 150–330)
RBC: 4.6 MIL/uL (ref 4.6–6.1)
RDW: 11.8 % (ref 11.6–14.4)
Seg Neut %: 58.4 %
WBC: 5.9 10*3/uL (ref 4.2–9.1)

## 2014-06-19 LAB — PLASMA PROF 7 (ED ONLY)
Anion Gap,PL: 13 (ref 7–16)
CO2,Plasma: 24 mmol/L (ref 20–28)
Chloride,Plasma: 102 mmol/L (ref 96–108)
Creatinine: 1.11 mg/dL (ref 0.67–1.17)
GFR,Black: 88 *
GFR,Caucasian: 76 *
Glucose,Plasma: 97 mg/dL (ref 60–99)
Potassium,Plasma: 3.9 mmol/L (ref 3.4–4.7)
Sodium,Plasma: 139 mmol/L (ref 132–146)
UN,Plasma: 18 mg/dL (ref 6–20)

## 2014-06-19 LAB — HM HIV SCREENING OFFERED

## 2014-06-19 LAB — D-DIMER, QUANTITATIVE: D-Dimer: <0.22 ug/mL FEU (ref 0.00–0.50)

## 2014-06-19 LAB — TROPONIN T
Troponin T: <0.01 ng/mL (ref 0.00–0.02)
Troponin T: <0.01 ng/mL (ref 0.00–0.02)

## 2014-06-19 LAB — HOLD SST

## 2014-06-19 MED ORDER — ASPIRIN 81 MG PO CHEW *I*
324.0000 mg | CHEWABLE_TABLET | Freq: Once | ORAL | Status: AC
Start: 2014-06-19 — End: 2014-06-19
  Administered 2014-06-19: 324 mg via ORAL
  Filled 2014-06-19: qty 4

## 2014-06-19 NOTE — ED Provider Notes (Addendum)
History     Chief Complaint   Patient presents with    Shortness of Breath       HPI Comments: This is a 53 year old man with a history of HLD who presents with SOB and sensation radiating down his left arm.   Pt awoke from sleep around 3 am with SOB, unable to catch his breath, lasted about 1 hour, symptoms have since resolved.   Pt describes strange sensation radiating down right arm at the same time, did not have chest or arm pain. Arm still feels "strange". No associated lightheadedness or fainting, no coughing, no fevers, no associated nausea or vomiting. No recent fevers.   No family history of heart disease. No history of DVT/PE. No history of DVT/PE, No swelling/pain of lower extremities, 10 hour car ride 2 weeks ago, no subsequent leg swelling, no hemoptysis        History provided by:  Patient and medical records      Past Medical History   Diagnosis Date    Pneumonia      Conversion Data - ^Resolved    Unspecified closed fracture of ankle      Conversion Data - Jenna Luo    Hyperlipidemia      Conversion Data - Jenna Luo            Past Surgical History   Procedure Laterality Date    Hx tonsillectomy/adenoidectomy       Tonsillectomy Conversion Data        Family History   Problem Relation Age of Onset    Conversion Other      [id number]^VWUJWJ Health Status^^Active^Father(A)AODM Mother(A) 2 Brothers(A)HTN 1 Sister(A)Graves.Crohns  Osteochondroma 2 Sons(A)1 Allergies 1 Daughter(A) PatGF(D)57.MI PatGM(D)68.Pneumonia due to hip frx.COPD MatGF(D)60s.CVA MatGM(D)80s.unsure.    Diabetes Father     Hypertension Brother     Hypertension Brother          Social History      reports that he has never smoked. He has never used smokeless tobacco. He reports that he drinks alcohol. He reports that he currently engages in sexual activity and has had male partners. He reports that he does not use illicit drugs.    Living Situation     Questions Responses    Patient lives with Spouse    Homeless      Caregiver for other family member     External Services     Employment     Domestic Violence Risk           Problem List     Patient Active Problem List   Diagnosis Code    Migraine Headache 346.90    Hypertension 401.9    Moles 448.1    Probable Right Lateral Meniscal Tear 836.1    Osteoarthritis of Cervical spine -mild 721.0    Possible Restless leg syndrome 333.94    Lateral epicondylitis  of elbow 726.32       Review of Systems   Review of Systems   Constitutional: Negative for fever.   HENT: Negative for congestion.    Eyes: Negative for visual disturbance.   Respiratory: Positive for shortness of breath.    Cardiovascular: Negative for chest pain.   Gastrointestinal: Negative for abdominal pain.   Genitourinary: Negative for dysuria.   Musculoskeletal: Negative for gait problem and neck stiffness.   Skin: Negative for rash.   Neurological: Negative for dizziness and syncope.   Psychiatric/Behavioral: Negative for confusion.       Physical  Exam     ED Triage Vitals   BP Heart Rate Heart Rate(via Pulse Ox) Resp Temp Temp Source SpO2 O2 Device O2 Flow Rate   06/19/14 0427 06/19/14 0427 -- 06/19/14 0427 06/19/14 0427 06/19/14 0427 06/19/14 0427 06/19/14 0427 --   152/100 mmHg 69  18 36.1 C (97 F) TEMPORAL 100 % None (Room air)       Weight           06/19/14 0427           104.327 kg (230 lb)               Physical Exam   Constitutional: He is oriented to person, place, and time. No distress.   HENT:   Head: Normocephalic and atraumatic.   Mouth/Throat: No oropharyngeal exudate.   Eyes: Pupils are equal, round, and reactive to light. No scleral icterus.   Neck: Normal range of motion. Neck supple.   Cardiovascular: Normal rate, regular rhythm, normal heart sounds and intact distal pulses.    Pulmonary/Chest: Effort normal and breath sounds normal. No respiratory distress. He has no wheezes. He has no rales. He exhibits no tenderness.   Abdominal: Soft. Bowel sounds are normal. There is no tenderness.    Musculoskeletal: Normal range of motion. He exhibits no edema.   Neurological: He is alert and oriented to person, place, and time.   Skin: Skin is warm and dry. No rash noted. He is not diaphoretic.   Psychiatric: He has a normal mood and affect.   Nursing note and vitals reviewed.      Medical Decision Making        Initial Evaluation:  ED First Provider Contact     Date/Time Event User Comments    06/19/14 0502 ED Provider First Contact ENDRIZZI, JULIE Initial Face to Face Provider Contact          Patient seen by me as above    Assessment:  53 y.o., male comes to the ED with dyspnea and strange sensation of arm, however at this time he has no symptoms. VS here normal. Recent prolonged travel, however no other risk factors for PE and minimal risk factors for ACS.     Differential Diagnosis includes   - ACS: risk factors include age and HLD  - PE - low risk by wells, good candidate for d-dimer screening  - Pneumothorax  - Costochondritis/musculoskeletal pain  - Dysrhythmia  - Electrolyte abnormality/dehydration                  Plan:   Diagnostic:   - Labs: CBC, BMP, Trop, d-dimer  - EKG - completed, no evidence of STE  - CXR    Therapeutic:   - ASA     Dispo: if labs normal will require repeat trop    Addendum: initial trop negative, d-dimer negative. given that this is a very atypical ACS presentation and this is a less likely diagnosis he is an excellent candidate for repeat trop at 6 hours and then dispo home      Marlise Eves, MD              Marlise Eves, MD  06/19/14 [phone]        Resident Attestation:     Patient seen by me 06/19/2014 at 0645    History:   I reviewed this patient, reviewed the resident's note and agree.  Exam:   I examined this patient, reviewed the resident's note and agree.  Decision Making:   I discussed with the resident his/her documented decision making  and agree.      Author Noah Delaine, MD    Noah Delaine, MD  06/20/14 [phone]

## 2014-06-19 NOTE — ED Notes (Signed)
Provider in room with pt.

## 2014-06-19 NOTE — ED Notes (Signed)
Pt states that he is feeling much better, and has no complaints at this time. Will continue to monitor and treat per MD orders.

## 2014-06-19 NOTE — ED Notes (Signed)
Complains of shortness of breath, and left arm tingling. Woke from sleep around 0300.

## 2014-06-19 NOTE — Discharge Instructions (Signed)
You were evaluated in the ED for chest/arm discomfort and shortness of breath.  Your EKG was normal and two troponin tests were negative.  You should plan to follow up with your PCP within the week to be seen at a follow up appointment and determine if additional testing is necessary.  You should review the attached information sheets for more details on chest pain and shortness of breath and for additional return precautions.  You should return to the ED if you develop any symptoms you find concerning.

## 2014-06-19 NOTE — ED Notes (Signed)
Hospital stay discussed with pt. Pt verbalized understanding to follow up with pCP within 1 week. VSS. Pt left with family.

## 2014-06-20 LAB — SPEC COAG REVIEW

## 2014-06-20 LAB — INTERPRETATION,SPEC COAG

## 2014-06-20 LAB — REVIEWED BY:

## 2014-06-21 ENCOUNTER — Ambulatory Visit: Payer: Self-pay | Admitting: Primary Care

## 2014-06-21 ENCOUNTER — Encounter: Payer: Self-pay | Admitting: Primary Care

## 2014-06-21 VITALS — BP 120/88 | HR 72 | Ht 73.0 in | Wt 233.0 lb

## 2014-06-21 DIAGNOSIS — R0602 Shortness of breath: Secondary | ICD-10-CM

## 2014-06-21 DIAGNOSIS — G4700 Insomnia, unspecified: Secondary | ICD-10-CM

## 2014-06-21 DIAGNOSIS — R202 Paresthesia of skin: Secondary | ICD-10-CM

## 2014-06-21 DIAGNOSIS — I10 Essential (primary) hypertension: Secondary | ICD-10-CM

## 2014-06-21 NOTE — Progress Notes (Signed)
Chief Complaint   Patient presents with    Shortness of Breath     follow up ER      Filed Vitals:    06/21/14 1143   BP: 120/88   Pulse: 72   Height: 1.854 m ( )   Weight: 105.688 kg (233 lb)     No current outpatient prescriptions on file.     No current facility-administered medications for this visit.      Medications reviewed and no changes made     He awoke 3AM on 06/19/14 to use the bathroom.  He noticed his breathing was mildly labored and left arm had tingling.  He went back to bed for 15 minutes, but then got back up to look up symptoms of a heart attack on the computer. No CP/N/V/palpitations/diaphoresis/cough. He went down stairs to sit in a comfortable chair.  It was near 4AM and he still had symptoms.  He decided to drive to [facility] ER.  By the time he got there, he was starting to feel better.  EKG was normal.  CXR normal.  Troponins were negative x 2.  D-dimer was negative (he had a 10 hour car ride 2 weeks prior).  He was sent home feeling back to normal.   He admits he has not been sleeping well the past few weeks since taking his oldest son to college ([employer]). He has trouble falling asleep.  Goes to bed midnight-1AM and gets up 6AM. He drinks 2-3 cans diet cola a day.   Coronary risk factors:  Male >45.  Oneal Grout MI age 57, HTN    EXAM:    AFFECT: Pleasant and cheerful in no acute distress  HEART: Regular rate and rhythm, no murmurs gallops or rubs.  LUNGS: Clear to auscultation bilaterally.  NEURO:  Normal strength/pinprick in both arms and hands.    Assessment/Plan:    1.  Shortness of breath/left arm paresthesias: Unclear cause.  Patient is currently symptom-free.  Extensive workup in the ER as above was unremarkable.  Patient is concerned about underlying cardiac cause.  Will check an exercise echo.  He will call me if symptoms recur.  We did discuss possibly stress may be contributing.  Relaxation techniques reviewed.  We also reviewed sleep hygiene-handout given and reviewed.  Call if  no better/worse  2.  HTN: Currently on no medicine.  Continue diet and exercise.    RETURN: 3 months HTN

## 2014-06-21 NOTE — Patient Instructions (Signed)
Stress echo- 9/21 at 9:30- [facility]

## 2014-06-21 NOTE — Addendum Note (Signed)
Addended byErvin Knack on: 06/21/2014 12:30 PM     Modules accepted: Orders

## 2014-06-22 LAB — EKG 12-LEAD
P: 10 degrees
QRS: -8 degrees
Rate: 69 {beats}/min
Severity: ABNORMAL
Severity: ABNORMAL
T: 15 degrees

## 2014-07-08 ENCOUNTER — Ambulatory Visit: Payer: Self-pay | Admitting: Primary Care

## 2014-07-11 ENCOUNTER — Ambulatory Visit
Admit: 2014-07-11 | Discharge: 2014-07-11 | Disposition: A | Discharge status: Home / Self Care | Payer: Self-pay | Source: Ambulatory Visit | Attending: Cardiology | Admitting: Cardiology

## 2014-07-11 ENCOUNTER — Ambulatory Visit
Admit: 2014-07-11 | Discharge: 2014-07-11 | Disposition: A | Discharge status: Home / Self Care | Payer: Self-pay | Source: Ambulatory Visit | Attending: Primary Care | Admitting: Primary Care

## 2014-07-11 DIAGNOSIS — R202 Paresthesia of skin: Secondary | ICD-10-CM

## 2014-07-11 DIAGNOSIS — R0602 Shortness of breath: Secondary | ICD-10-CM

## 2014-07-11 MED ORDER — PERFLUTREN PROTEIN A MICROSPH (OPTISON) IV SUSP *I*
1.5000 mL | INTRAVENOUS | Status: AC | PRN
Start: 2014-07-11 — End: 2014-07-11
  Administered 2014-07-11 (×2): 1.5 mL via INTRAVENOUS

## 2014-07-11 MED ORDER — SODIUM CHLORIDE 0.9 % IV SOLN WRAPPED *I*
10.0000 mL | Status: AC | PRN
Start: 2014-07-11 — End: 2014-07-11

## 2014-07-11 NOTE — Discharge Instructions (Signed)
Patient is here for a stress echo. Instructed patient to resume medications and to follow up with referring provider. Patient verbalized understanding.

## 2014-07-12 LAB — EXERCISE STRESS ECHO COMPLETE
Aortic Diameter (mid tubular): 3.54 cm
Aortic Diameter (sinus of Valsalva): 3.36 cm
BMI: 30.4 kg/m2
BP Diastolic: 92 mmHg
BP Systolic: 130 mmHg
BSA: 2.32 m2
Deceleration Time - MV: 141.03 ms
E/A ratio: 0.98
Estimated workload: 11.5 METS
Heart Rate: 60 {beats}/min
Height: 73 in
LA Diameter BSA Index: 1.6 cm/m2
LA Diameter Height Index: 2 cm/m
LA Diameter: 3.76 cm
LV ASE Mass BSA Index: 63.6 gm/m2
LV ASE Mass Height 2.7 Index: 27.8 gm/m2.7
LV ASE Mass Height Index: 79.6 gm/m
LV ASE Mass: 147.5 gm
LV Isovolumic Relaxation Time: 66.9 ms
LV Posterior Wall Thickness: 1 cm
LV Septal Thickness: 0.95 cm
LVED Diameter BSA Index: 1.9 cm/m2
LVED Diameter Height Index: 2.4 cm/m
LVED Diameter: 4.49 cm
LVOT Area (calculated): 3.56 cm2
LVOT Cardiac Index: 1.82 L/min/m2
LVOT Cardiac Output: 4.23 L/min
LVOT Diameter: 2.13 cm
LVOT PWD VTI: 19.78 cm
LVOT PWD Velocity (mean): 73.92 cm/s
LVOT PWD Velocity (peak): 103.45 cm/s
LVOT SV BSA Index: 30.36 mL/m2
LVOT SV Height Index: 38 mL/m
LVOT Stroke Rate (mean): 263.3 mL/s
LVOT Stroke Rate (peak): 368.4 mL/s
LVOT Stroke Volume: 70.45 cc
MPHR: 177.957 {beats}/min
MV Peak A Velocity: 61.48 cm/s
MV Peak E Velocity: 60.49 cm/s
Mitral Annular E/Ea Vel Ratio: 8.96
Mitral Annular Ea Velocity: 6.75 cm/s
O2 sat peak: 96 %
O2 sat rest: 98 %
Peak DBP: 80 mmHg
Peak HR: 178 {beats}/min
Peak SBP: 178 mmHg
Percent MPHR: 105.3 %
RPP: 31684 BPM x mmHG
RR Interval: 1000 ms
Stress Peak Stage: 3.56
Stress duration (min): 10 min
Stress duration (sec): 40 s
Weight: 3680 oz

## 2014-09-27 ENCOUNTER — Ambulatory Visit: Payer: Self-pay | Admitting: Primary Care

## 2014-09-27 ENCOUNTER — Encounter: Payer: Self-pay | Admitting: Primary Care

## 2014-09-27 VITALS — BP 120/80 | HR 76 | Ht 73.0 in | Wt 238.0 lb

## 2014-09-27 DIAGNOSIS — E669 Obesity, unspecified: Secondary | ICD-10-CM

## 2014-09-27 DIAGNOSIS — I10 Essential (primary) hypertension: Secondary | ICD-10-CM

## 2014-09-27 NOTE — Progress Notes (Signed)
Chief Complaint   Patient presents with    Hypertension    Obesity      HYPERTENSION MANAGEMENT:     BP 120/80 mmHg   Pulse 76   Ht 1.854 m (6\' 1" )   Wt 107.956 kg (238 lb)   BMI 31.41 kg/m2        PATIENT DENIES: [x]  Headaches. [x]  Chest Pain. [x]  Palpitations. [x]  Shortness of Breath    HABITS:   --Sodium-   [] Yes  [x] No          --Exercise-  [] Yes  [x] None due to working alot but belongs to the [employer]          --Smoking-  [] Yes  [x] No          --Caffeine Use- [x] Yes 1 diet Pepsi/d (was 4-5 a day at one point)  [] No             HOME BLOOD PRESSURE: not taking    He gained 8 lbs in 6 months. No fast food.  Eats out once a week for dinner.  He brings his lunch to work. He snacks on chips at night. Rare alcohol.  He eats approximately 3 servings of fruits and vegetables a day.      EXAM:    HEART: Regular rate and rhythm, no murmurs gallops or rubs.  LUNGS: Clear to auscultation bilaterally.     ASSESSMENT/PLAN:    1. HTN: Presently well-controlled and at goal of less than 140/90.  Diet and exercise reviewed.  Currently on no medicine  2.  Overweight: Healthy food choices/portion control reviewed.  We also reviewed how to count calories/portion sizes using the Lose It App  3.  Health maintenance: Flu shot given    RETURN: 3 months HTN

## 2014-12-28 ENCOUNTER — Ambulatory Visit: Payer: Self-pay | Admitting: Primary Care

## 2015-01-24 ENCOUNTER — Ambulatory Visit: Payer: Self-pay | Admitting: Primary Care

## 2015-02-28 ENCOUNTER — Encounter: Payer: Self-pay | Admitting: Primary Care

## 2015-02-28 ENCOUNTER — Ambulatory Visit: Payer: Self-pay | Admitting: Primary Care

## 2015-02-28 VITALS — BP 120/88 | Ht 73.0 in | Wt 232.0 lb

## 2015-02-28 DIAGNOSIS — Z139 Encounter for screening, unspecified: Secondary | ICD-10-CM

## 2015-02-28 DIAGNOSIS — I10 Essential (primary) hypertension: Secondary | ICD-10-CM

## 2015-02-28 NOTE — Progress Notes (Signed)
Chief Complaint   Patient presents with    Hypertension     HYPERTENSION MANAGEMENT:     BP 120/88 mmHg   Ht 1.854 m (6\' 1" )   Wt 105.235 kg (232 lb)   BMI 30.62 kg/m2     He lost 6 lbs since last visit with smaller portions/less snacking/more exercise     PATIENT DENIES: [x]  Headaches. [x]  Chest Pain. [x]  Palpitations. [x]  Shortness of Breath.    SYMPTOMS:     HABITS:   --Sodium-   [] Yes  [x] No          --Exercise-  [x] Yes 3x week [employer] elliptical for 35 minutes  [] No          --Smoking-  [] Yes  [x] No          --Caffeine Use- [x] Yes 1 diet Pepsi/d  [] No             HOME BLOOD PRESSURE: not taking    EXAM:    HEART: Regular rate and rhythm, no murmurs gallops or rubs.  LUNGS: Clear to auscultation bilaterally.       ASSESSMENT/PLAN:    1.  AVW:UJWJXBJYNHTN:Presently well-controlled and at goal of less than 140/90.  Diet and exercise reviewed.  Currently on no medicine  2.  Health maintenance: Order placed to check screening PSA    RETURN: 3 months HTN

## 2015-03-06 ENCOUNTER — Ambulatory Visit: Payer: Self-pay | Admitting: Primary Care

## 2015-05-31 ENCOUNTER — Encounter: Payer: Self-pay | Admitting: Primary Care

## 2015-05-31 ENCOUNTER — Ambulatory Visit: Payer: Self-pay | Admitting: Primary Care

## 2015-05-31 VITALS — BP 120/80 | HR 60 | Ht 73.0 in | Wt 231.0 lb

## 2015-05-31 DIAGNOSIS — E663 Overweight: Secondary | ICD-10-CM

## 2015-05-31 DIAGNOSIS — I10 Essential (primary) hypertension: Secondary | ICD-10-CM

## 2015-05-31 NOTE — Progress Notes (Signed)
Chief Complaint   Patient presents with    Hypertension    Obesity     HYPERTENSION MANAGEMENT:     Visit Vitals    BP 120/80    Pulse 60    Ht 1.854 m ( )    Wt 104.8 kg (231 lb)    BMI 30.48 kg/m2           PATIENT DENIES:  Headaches.  Chest Pain.  Palpitations.  Shortness of Breath.    HABITS:   --Sodium-   Yes  No          --Exercise-  Yes elliptical 2-4x week, walks a few times a week  No          --Smoking-  Yes  No          --Caffeine Use- Yes 1 diet Pepsi/d  No             HOME BLOOD PRESSURE: not taking    He is overweight. He snacks at night on skinny popcorn/nuts.  He has been watching portions and has lost 7 lbs since 09/2015. 2-3 fruits/veges a day. He briefly has tried the App Lose It    EXAM:    HEART: Regular rate and rhythm, no murmurs gallops or rubs.  LUNGS: Clear to auscultation bilaterally.     ASSESSMENT/PLAN:    1. HTN:  Stable and at goal <140/90.  On no meds.  Diet /exercise reviewed.  2.  Overweight:  Diet/exercise reviewed  3.  Health Maintenance:  He still needs to do his PSA    RETURN:  3 months HTN/overweight  & 02/216 CME

## 2015-06-12 ENCOUNTER — Ambulatory Visit
Admission: RE | Admit: 2015-06-12 | Discharge: 2015-06-12 | Disposition: A | Discharge status: Home / Self Care | Payer: Self-pay | Source: Ambulatory Visit | Attending: Primary Care | Admitting: Primary Care

## 2015-06-12 DIAGNOSIS — Z139 Encounter for screening, unspecified: Secondary | ICD-10-CM

## 2015-06-12 LAB — PSA (EFF.4-2010): PSA (eff. 4-2010): 0.32 ng/mL (ref 0.00–4.00)

## 2015-09-01 ENCOUNTER — Ambulatory Visit: Payer: Self-pay | Admitting: Primary Care

## 2015-09-01 ENCOUNTER — Encounter: Payer: Self-pay | Admitting: Primary Care

## 2015-09-01 VITALS — BP 110/84 | HR 72 | Ht 73.0 in | Wt 232.0 lb

## 2015-09-01 DIAGNOSIS — I10 Essential (primary) hypertension: Secondary | ICD-10-CM

## 2015-09-01 DIAGNOSIS — E669 Obesity, unspecified: Secondary | ICD-10-CM

## 2015-09-01 NOTE — Progress Notes (Signed)
Chief Complaint   Patient presents with    Hypertension    Obesity     HYPERTENSION MANAGEMENT:     Visit Vitals    BP 110/84    Pulse 72    Ht 1.854 m (6\' 1" )    Wt 105.2 kg (232 lb)    BMI 30.61 kg/m2      PATIENT DENIES: [x]  Headaches (no migraines in > 1 year). [x]  Chest Pain. [x]  Palpitations. [x]  Shortness of Breath.    HABITS:   --Sodium-   [] Yes  [x] No          --Exercise-  [x] Yes elliptical 3x week for 5-6 miles a time  [] No          --Smoking-  [] Yes  [x] No          --Caffeine Use- [x] Yes 1 diet Pepsi/d  [] No           HOME BLOOD PRESSURE: not taking    He lost 6 lbs since 09/2014 and is frustrated he has has not lost more. He rarely has fast food 0-1x a month. Sweets 2x week. Pasta once a week.  Rare alcohol. He snacks at night on chips/popcorn.   2-3 servings fruit/veges a day.  He has counted calories with Lose It in the past.    EXAM:    HEART: Regular rate and rhythm, no murmurs gallops or rubs.  LUNGS: Clear to auscultation bilaterally.     ASSESSMENT/PLAN:    1. HTN:   Presently well-controlled and at goal of less than 140/90.  Diet and exercise reviewed.  On no meds.  2.  Overweight: diet/exercise reviewed.  Portion control and healthy food choices reviewed.  3. Health Maintenance:  Flu shot given    RETURN: 3 months HTN  & 02/2016 CME

## 2015-10-09 ENCOUNTER — Ambulatory Visit: Payer: Self-pay | Admitting: Primary Care

## 2015-10-09 ENCOUNTER — Encounter: Payer: Self-pay | Admitting: Primary Care

## 2015-10-09 VITALS — BP 130/88 | HR 64 | Temp 98.1°F | Ht 73.0 in | Wt 227.0 lb

## 2015-10-09 DIAGNOSIS — R0981 Nasal congestion: Secondary | ICD-10-CM

## 2015-10-09 DIAGNOSIS — J029 Acute pharyngitis, unspecified: Secondary | ICD-10-CM

## 2015-10-09 NOTE — Progress Notes (Signed)
Chief Complaint   Patient presents with    URI     Visit Vitals    BP 130/88    Pulse 64    Temp 36.7 C (98.1 F)    Ht 1.854 m (6\' 1" )    Wt 103 kg (227 lb)    SpO2 97%    BMI 29.95 kg/m2        ONSET:    1 week. Daughter was sick with ST and was negative for strep.     SYMPTOMS:         Cough :      [] Yes  [x] No           Nasal Congestion:  [x] Yes  [] No            Rhinorrhea:    [] Yes  [x] No              Fever:     [] Yes  [x] No              Chills:    [] Yes  [x] No              Sore Throat:    [x] Yes 1 week ago x 5 days  [] No             Post Nasal Drip:    [x] Yes initially  [] No                 Otalgia:     [] Yes  [x] No                 Hoarseness:    [x] Yes initially  [] No                Sinus Tenderness:   [] Yes  [x] No                Headache:    [] Yes  [x] No                 Shortness of Breath:   [] Yes  [x] No           RELIEVED BY:    Took Mucinex/cough drops/Listerine and salt water garggles/Airborne    EXAM:    HEENT: Normal eyes.  Ears normal. Oropharynx normal. No rhinitis.  NECK: Normal  LUNGS: Clear to auscultation bilaterally     Assessment/Plan:    1.  Pharyngitis/nasal congestion/postnasal drip: Much improved.  Most likely viral etiology.  Okay to continue Listerine/salt water gargles as needed.  Advil as needed.  Maintain adequate hydration.  Call if symptoms recur    RETURN: 11/2015 HTN  & 02/2016 CME

## 2015-12-12 ENCOUNTER — Encounter: Payer: Self-pay | Admitting: Primary Care

## 2015-12-12 ENCOUNTER — Ambulatory Visit: Payer: Self-pay | Admitting: Primary Care

## 2015-12-12 VITALS — BP 118/68 | HR 64 | Resp 17 | Ht 72.0 in | Wt 220.6 lb

## 2015-12-12 DIAGNOSIS — I10 Essential (primary) hypertension: Secondary | ICD-10-CM

## 2015-12-12 DIAGNOSIS — E669 Obesity, unspecified: Secondary | ICD-10-CM

## 2015-12-12 NOTE — Progress Notes (Signed)
Chief Complaint   Patient presents with    Hypertension    Obesity     HYPERTENSION MANAGEMENT:     Visit Vitals    BP 118/68 (BP Location: Left arm, Patient Position: Sitting, Cuff Size: large adult)    Pulse 64    Resp 17    Ht 1.829 m (6')    Wt 100.1 kg (220 lb 9.6 oz)    SpO2 99%    BMI 29.92 kg/m2      He lost 12 lbs since 08/2015 by eating less bread/chips and more fruit/veges.  Also eating smaller portions. Rare sweets.      PATIENT DENIES:  Headaches.  Chest Pain.  Palpitations.  Shortness of Breath.    HABITS:   --Sodium-   Yes  No          --Exercise-  Yes 28 miles/week on the Elliptical and 80 pushups/week No          --Smoking-  Yes  No          --Caffeine Use- Yes 1 diet Pepsi/d  No             HOME BLOOD PRESSURE: not taking     EXAM:    HEART: Regular rate and rhythm, no murmurs gallops or rubs.  LUNGS: Clear to auscultation bilaterally.       ASSESSMENT/PLAN:    1. HTN:   Presently well-controlled and at goal of less than 140/90.  Diet and exercise reviewed.  On no meds  2.  Overweight:  Doing a good job with diet/exercise.  He is trying to get ~200 lbs.     RETURN:  02/2016 CME

## 2016-03-05 ENCOUNTER — Ambulatory Visit: Payer: Self-pay | Admitting: Primary Care

## 2016-03-07 ENCOUNTER — Encounter: Payer: Self-pay | Admitting: Primary Care

## 2016-03-07 ENCOUNTER — Ambulatory Visit: Payer: Self-pay | Admitting: Primary Care

## 2016-03-07 VITALS — BP 122/82 | HR 60 | Resp 16 | Ht 72.5 in | Wt 205.2 lb

## 2016-03-07 DIAGNOSIS — E663 Overweight: Secondary | ICD-10-CM

## 2016-03-07 DIAGNOSIS — Z0000 Encounter for general adult medical examination without abnormal findings: Secondary | ICD-10-CM

## 2016-03-07 DIAGNOSIS — I10 Essential (primary) hypertension: Secondary | ICD-10-CM

## 2016-03-07 DIAGNOSIS — G43909 Migraine, unspecified, not intractable, without status migrainosus: Secondary | ICD-10-CM

## 2016-03-07 NOTE — H&P (Signed)
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    Possible Restless leg syndrome G25.81    Lateral epicondylitis  of elbow M77.10    Overweight E66.3        Past Medical/Surgical History:   Past Medical History:   Diagnosis Date    Hyperlipidemia     Conversion Data - ^Resolved    Pneumonia     Conversion Data - ^Resolved    Unspecified closed fracture of ankle     Conversion Data Jenna Luo- ^Resolved     Past Surgical History:   Procedure Laterality Date    HX TONSILLECTOMY/ADENOIDECTOMY      Tonsillectomy Conversion Data          Allergies:  No Known Allergies (drug, envir, food or latex)    Current medications:    No current outpatient prescriptions on file.       Family History:    Family History   Problem Relation Age of Onset    Conversion Other      [id number]^Family Health Status^^Active^Father(A)AODM Mother(A) 2 Brothers(A)HTN 1 Sister(A)Graves.Crohns  Osteochondroma 2 Sons(A)1 Allergies 1 Daughter(A) PatGF(D)57.MI PatGM(D)68.Pneumonia due to hip frx.COPD MatGF(D)60s.CVA MatGM(D)80s.unsure.    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 Negative for chills, fever and malaise/fatigue.        Normal appetite.  No night sweats.   HENT: Negative for congestion, ear pain, hearing loss, nosebleeds, sore throat and tinnitus.         No rhinitis   Eyes: Negative for blurred vision, double vision, pain, discharge and redness.        Wears glasses   Respiratory: Negative for cough, hemoptysis, shortness of breath and wheezing.    Cardiovascular: Negative for chest pain, palpitations, orthopnea and leg swelling.   Gastrointestinal: Negative for abdominal pain, blood in stool, constipation, diarrhea, heartburn, melena, nausea and vomiting.        No Dysphagia   Genitourinary:  Negative for dysuria, flank pain, frequency, hematuria and urgency.        No Nocturia.  No penile sores/discharge.  No testicle pain/masses.  No hernias.  No erectile dysfunction.   Musculoskeletal: Positive for neck pain (sporadic ). Negative for back pain, falls, joint pain and myalgias.   Skin: Negative for itching and rash.        No mole changes   Neurological: Positive for headaches (~2 migraines a year if stressed/weather changes/fatigued. ). Negative for dizziness, tingling, sensory change, focal weakness, loss of consciousness and weakness.   Endo/Heme/Allergies: Negative for polydipsia. Does not bruise/bleed easily.        No adenopathy.   Psychiatric/Behavioral: Negative for depression and memory loss. The patient is not nervous/anxious and does not have insomnia (but only sleeps 5-6 hours).    NUTRITION:     -Caffeine: 1 diet Pepsi/day   -Salt: none   -Cholesterol: no fast food, minimal sweets or junk food, drinks skim and unsweetened Almond, eats a lot of fruit/veges    SAFETY:     -Seatbelt: yes   -Safe Sex: no affairs    SELF TESTICLE EXAM (MALE): no    EXERCISE: 4 days a  week    PROXY: Given      Vital Signs:   Visit Vitals    BP 128/60 (BP Location: Left arm, Patient Position: Sitting, Cuff Size: large adult)    Pulse 60    Resp 16    Ht 1.842 m (6' 0.5")    Wt 93.1 kg (205 lb 3.2 oz)    BMI 27.45 kg/m2     Vitals:    03/07/16 0832   BP: 122/82   Pulse:    Resp:    Weight:    Height:         EKG:  Sinus Brady 52.  Old Q/inverted T III compared to 06/19/14    PHYSICAL EXAM:  Physical Exam   Constitutional: He is oriented to person, place, and time. He appears well-developed and well-nourished. No distress.   HENT:   Head: Normocephalic.   Right Ear: Hearing, tympanic membrane, external ear and ear canal normal.   Left Ear: Hearing, tympanic membrane, external ear and ear canal normal.   Nose: Nose normal.   Mouth/Throat: Uvula is midline, oropharynx is clear and moist and mucous membranes are normal. No oral lesions. Normal dentition. No oropharyngeal exudate.   Eyes: Conjunctivae, EOM and lids are normal. Pupils are equal, round, and reactive to light. Right eye exhibits no discharge. Left eye exhibits no discharge. No scleral icterus.   Neck: Normal range of motion. Neck supple. Carotid bruit is not present. No thyroid mass and no thyromegaly present.   Cardiovascular: Normal rate, regular rhythm, normal heart sounds and intact distal pulses.  Exam reveals no gallop and no friction rub.    No murmur heard.  Pulmonary/Chest: Effort normal and breath sounds normal. No respiratory distress. He has no wheezes. He has no rales.   Abdominal: Soft. Bowel sounds are normal. He exhibits no distension, no abdominal bruit, no pulsatile midline mass and no mass. There is no splenomegaly or hepatomegaly. There is no tenderness. There is no rebound and no guarding. No hernia. Hernia confirmed negative in the right inguinal area and confirmed negative in the left inguinal area.   Genitourinary: Prostate normal, testes normal and penis normal. Rectal exam shows no external hemorrhoid, anal  tone normal and guaiac negative stool. Prostate is not enlarged.   Musculoskeletal:  Normal range of motion. He exhibits no edema or tenderness.   Congenital curved pinkies   Neurological: He is alert and oriented to person, place, and time. He displays normal reflexes. No cranial nerve deficit. He exhibits normal muscle tone. He displays a negative Romberg sign. Coordination and gait normal.   Skin: Skin is warm and dry. No rash noted. No cyanosis. Nails show no clubbing.   No mole changes   Psychiatric: He has a normal mood and affect.           Assessment:    [name] [name] was seen today for annual exam.    Diagnoses and all orders for this visit:    Annual physical exam  -     Comprehensive metabolic panel; Future  -     CBC and differential; Future  -     PSA (eff.01-2009); Future  -     Urinalysis with microscopic; Future    HTN (hypertension)  -     Comprehensive metabolic panel; Future  -     Urinalysis with microscopic; Future    Migraines  -     Comprehensive metabolic panel; Future    Overweight       .      Plan:         Assessment/Plan:    1.  ZOX:[id number] well-controlled and at goal of less than 140/90.  Diet and exercise reviewed.  Currently on no medicine  2.  Migraine headaches: Infrequent.  Fatigue/stress/weather changes can be triggers.  I did recommend he try to increase his sleep to avoid fatigue.  He does use Motrin with diet Pepsi if he does have a migraine which helps.  3.  Moles: Continue self checking himself and using sunscreen  4.  DJD cervical spine: Occasionally symptomatic.  Proper posture and ergonomics reviewed.  5.  Overweight: Patient has done a great job losing weight with diet and exercise.  6.  Health maintenance: Patient was advised to make a routine eye appointment.  7.  Labs: SMAC/FLP/CBC with differential/PSA/UA    RETURN: 3 months HTN

## 2016-03-15 ENCOUNTER — Encounter: Payer: Self-pay | Admitting: Primary Care

## 2016-05-17 ENCOUNTER — Other Ambulatory Visit
Admission: RE | Admit: 2016-05-17 | Discharge: 2016-05-17 | Disposition: A | Discharge status: Home / Self Care | Payer: Self-pay | Source: Ambulatory Visit | Attending: Primary Care | Admitting: Primary Care

## 2016-05-17 DIAGNOSIS — G43909 Migraine, unspecified, not intractable, without status migrainosus: Secondary | ICD-10-CM

## 2016-05-17 DIAGNOSIS — I10 Essential (primary) hypertension: Secondary | ICD-10-CM

## 2016-05-17 DIAGNOSIS — Z0000 Encounter for general adult medical examination without abnormal findings: Secondary | ICD-10-CM

## 2016-05-17 LAB — URINALYSIS WITH MICROSCOPIC
Blood,UA: NEGATIVE
Ketones, UA: NEGATIVE
Leuk Esterase,UA: NEGATIVE
Nitrite,UA: NEGATIVE
Protein,UA: NEGATIVE mg/dL
RBC,UA: NONE SEEN /hpf (ref 0–2)
Specific Gravity,UA: 1.015 (ref 1.002–1.030)
WBC,UA: NONE SEEN /hpf (ref 0–5)
pH,UA: 6 (ref 5.0–8.0)

## 2016-05-17 LAB — CBC AND DIFFERENTIAL
Baso # K/uL: 0 10*3/uL (ref 0.0–0.1)
Basophil %: 0.7 %
Eos # K/uL: 0.1 10*3/uL (ref 0.0–0.5)
Eosinophil %: 2 %
Hematocrit: 40 % (ref 40–51)
Hemoglobin: 14.6 g/dL (ref 13.7–17.5)
IMM Granulocytes #: 0 10*3/uL (ref 0.0–0.1)
IMM Granulocytes: 0.2 %
Lymph # K/uL: 1.5 10*3/uL (ref 1.3–3.6)
Lymphocyte %: 28.4 %
MCH: 34 pg/cell — ABNORMAL HIGH (ref 26–32)
MCHC: 36 g/dL (ref 32–37)
MCV: 93 fL — ABNORMAL HIGH (ref 79–92)
Mono # K/uL: 0.5 10*3/uL (ref 0.3–0.8)
Monocyte %: 9.4 %
Neut # K/uL: 3.2 10*3/uL (ref 1.8–5.4)
Nucl RBC # K/uL: 0 10*3/uL (ref 0.0–0.0)
Nucl RBC %: 0 /100 WBC (ref 0.0–0.2)
Platelets: 230 10*3/uL (ref 150–330)
RBC: 4.3 MIL/uL — ABNORMAL LOW (ref 4.6–6.1)
RDW: 11.7 % (ref 11.6–14.4)
Seg Neut %: 59.3 %
WBC: 5.4 10*3/uL (ref 4.2–9.1)

## 2016-05-17 LAB — LIPID PANEL
Chol/HDL Ratio: 3
Cholesterol: 151 mg/dL
HDL: 50 mg/dL
LDL Calculated: 89 mg/dL
Non HDL Cholesterol: 101 mg/dL
Triglycerides: 62 mg/dL

## 2016-05-17 LAB — COMPREHENSIVE METABOLIC PANEL
ALT: 22 U/L (ref 0–50)
AST: 25 U/L (ref 0–50)
Albumin: 4.6 g/dL (ref 3.5–5.2)
Alk Phos: 71 U/L (ref 40–130)
Anion Gap: 17 — ABNORMAL HIGH (ref 7–16)
Bilirubin,Total: 0.6 mg/dL (ref 0.0–1.2)
CO2: 22 mmol/L (ref 20–28)
Calcium: 9.2 mg/dL (ref 8.6–10.2)
Chloride: 104 mmol/L (ref 96–108)
Creatinine: 1.03 mg/dL (ref 0.67–1.17)
GFR,Black: 95 *
GFR,Caucasian: 82 *
Glucose: 89 mg/dL (ref 60–99)
Lab: 16 mg/dL (ref 6–20)
Potassium: 5.1 mmol/L (ref 3.3–5.1)
Sodium: 143 mmol/L (ref 133–145)
Total Protein: 6.3 g/dL (ref 6.3–7.7)

## 2016-05-17 LAB — PSA (EFF.4-2010): PSA (eff. 4-2010): 0.35 ng/mL (ref 0.00–4.00)

## 2016-06-18 ENCOUNTER — Encounter: Payer: Self-pay | Admitting: Primary Care

## 2016-06-18 ENCOUNTER — Ambulatory Visit: Payer: Self-pay | Admitting: Primary Care

## 2016-06-18 VITALS — BP 120/80 | HR 60 | Ht 72.0 in | Wt 201.0 lb

## 2016-06-18 DIAGNOSIS — R42 Dizziness and giddiness: Secondary | ICD-10-CM

## 2016-06-18 DIAGNOSIS — I10 Essential (primary) hypertension: Secondary | ICD-10-CM

## 2016-06-18 DIAGNOSIS — R519 Headache, unspecified: Secondary | ICD-10-CM

## 2016-06-18 DIAGNOSIS — H938X2 Other specified disorders of left ear: Secondary | ICD-10-CM

## 2016-06-18 NOTE — Progress Notes (Signed)
Chief Complaint   Patient presents with    Hypertension    Dizziness    Other     Left Ear Pressure     No current outpatient prescriptions on file.     No current facility-administered medications for this visit.      Medications reviewed and no changes made     HYPERTENSION MANAGEMENT:     BP 120/80   Pulse 60   Ht 1.829 m (6')   Wt 91.2 kg (201 lb)   BMI 27.26 kg/m2     He has lost 31 lbs since 08/2015 with better diet/exercise     PATIENT DENIES: [x]  Headaches. [x]  Chest Pain. [x]  Palpitations. [x]  Shortness of Breath .    SYMPTOMS: He has had episodic head pulsing pressure/tingling since early 04/2016. It can be daily at times for seconds to up to 30 minutes.  No actual pain/HA.  No visual changes. The past 2 weeks, he also has a pressure/water sensation in left ear. No ear pain/hearing loss/tinnitus. No recent falls/head trauma.  He can feel dizzy when he stands.  No vertigo. He is not taking any meds for this. Denies any weakness/paresthesias in arms/legs. He does have a history of migraines. Denies family hx brain aneurysms.    HABITS:   --Sodium-   [] Yes  [x] No          --Exercise-  [x] Yes [facility]x week at [facility] [employer]  [] No          --Smoking-  [] Yes  [x] No          --Caffeine Use- [x] Yes 1 diet Pepsi/day  [] No             HOME BLOOD PRESSURE: not taking    EXAM:    AFFECT: Pleasant and cheerful in no acute distress  EARS:  Normal tympanic membranes and hearing bilaterally  HEART: Regular rate and rhythm, no murmurs gallops or rubs.  LUNGS: Clear to auscultation bilaterally.  NEURO:  Cranial nerves2-12 intact.  Normal strength/sensation/reflexes in his arms and legs        ASSESSMENT/PLAN:    1.  ZHY:[id number]:Presently well-controlled and at goal of less than 140/90.  Diet and exercise reviewed.  Currently on no medicine.  He has done a great job losing weight.  2.  Dizziness/head pressure/left ear fullness: Unclear cause.  Possible causes include atypical migraine/occult masses/occult aneurysm.  An MRI/MRA of the brain will  be ordered.  Patient was advised to call me if no better/worse.  He may require a neurology consult.  3.  Health maintenance: Patient will get a flu shot in the fall    RETURN: 3 months HTN

## 2016-06-21 ENCOUNTER — Other Ambulatory Visit: Payer: Self-pay | Admitting: Primary Care

## 2016-06-21 DIAGNOSIS — R42 Dizziness and giddiness: Secondary | ICD-10-CM

## 2016-07-23 ENCOUNTER — Encounter: Payer: Self-pay | Admitting: Gastroenterology

## 2016-09-18 ENCOUNTER — Encounter: Payer: Self-pay | Admitting: Primary Care

## 2016-09-18 ENCOUNTER — Ambulatory Visit: Payer: Self-pay | Admitting: Primary Care

## 2016-09-18 VITALS — BP 120/70 | HR 60 | Ht 72.0 in | Wt 200.0 lb

## 2016-09-18 DIAGNOSIS — I10 Essential (primary) hypertension: Secondary | ICD-10-CM

## 2016-09-18 DIAGNOSIS — Z23 Encounter for immunization: Secondary | ICD-10-CM

## 2016-09-18 NOTE — Progress Notes (Signed)
Chief Complaint   Patient presents with    Hypertension     HYPERTENSION MANAGEMENT:     BP 120/70   Pulse 60   Ht 1.829 m (6')   Wt 90.7 kg (200 lb)   BMI 27.12 kg/m2        PATIENT DENIES: [x]  Headaches. [x]  Chest Pain. [x]  Palpitations. [x]  Shortness of Breath.    HABITS:   --Sodium-   [] Yes  [x] No          --Exercise-  [x] Yes 3-4x week  [] No          --Smoking-  [] Yes  [x] No          --Caffeine Use- [x] Yes 1 diet Pepsi/d  [] No             HOME BLOOD PRESSURE:  normal    EXAM:    HEART: Regular rate and rhythm, no murmurs gallops or rubs.  LUNGS: Clear to auscultation bilaterally.       ASSESSMENT/PLAN:    1. HTN:   Presently well-controlled and at goal of less than 140/90.  Diet and exercise reviewed. On no meds  2.  Health maintenance: Flu shot given.     RETURN:  4 months HTN

## 2017-01-17 ENCOUNTER — Ambulatory Visit: Payer: Self-pay | Admitting: Primary Care

## 2017-01-31 ENCOUNTER — Encounter: Payer: Self-pay | Admitting: Primary Care

## 2017-01-31 ENCOUNTER — Ambulatory Visit: Payer: Self-pay | Admitting: Primary Care

## 2017-01-31 ENCOUNTER — Ambulatory Visit: Payer: Commercial Managed Care - PPO | Attending: Primary Care | Admitting: Primary Care

## 2017-01-31 VITALS — BP 120/80 | HR 72 | Ht 72.0 in | Wt 198.0 lb

## 2017-01-31 DIAGNOSIS — I10 Essential (primary) hypertension: Secondary | ICD-10-CM

## 2017-01-31 NOTE — Progress Notes (Signed)
Chief Complaint   Patient presents with    Hypertension     HYPERTENSION MANAGEMENT:     BP 120/80   Pulse 72   Ht 1.829 m (6')   Wt 89.8 kg (198 lb)   BMI 26.85 kg/m2        PATIENT DENIES:  Headaches.  Chest Pain.  Palpitations.  Shortness of Breath.    HABITS:   --Sodium-   Yes  No          --Exercise-  Yes 30 miles week elliptical  No          --Smoking-  Yes  No          --Caffeine Use- Yes 1 diet Pepsi/d  No             HOME BLOOD PRESSURE: normal    EXAM:    HEART: Regular rate and rhythm, no murmurs gallops or rubs.  LUNGS: Clear to auscultation bilaterally.     ASSESSMENT/PLAN:    1. HTN:   Presently well-controlled and at goal of less than 140/90.  Diet and exercise reviewed-he is doing a great job with these.  On no meds  2.  Health maintenance: Patient is due for a tetanus shot.  We also discussed the Shingrix vaccine.  He plans on calling his insurance company to find out covered for these.  If he wants these shots, he will schedule a nurse visit      RETURN: 4 months HTN

## 2017-06-11 ENCOUNTER — Encounter: Payer: Self-pay | Admitting: Primary Care

## 2017-06-11 ENCOUNTER — Ambulatory Visit: Payer: Commercial Managed Care - PPO | Attending: Primary Care | Admitting: Primary Care

## 2017-06-11 VITALS — BP 120/82 | HR 64 | Ht 72.0 in | Wt 206.0 lb

## 2017-06-11 DIAGNOSIS — G43009 Migraine without aura, not intractable, without status migrainosus: Secondary | ICD-10-CM

## 2017-06-11 DIAGNOSIS — I10 Essential (primary) hypertension: Secondary | ICD-10-CM

## 2017-06-11 DIAGNOSIS — G4700 Insomnia, unspecified: Secondary | ICD-10-CM

## 2017-06-11 NOTE — Progress Notes (Signed)
Chief Complaint   Patient presents with    Hypertension    Migraine     No current outpatient prescriptions on file.     No current facility-administered medications for this visit.      Medications reviewed and no changes made      HYPERTENSION MANAGEMENT:      Stable since last visit.      BP 120/82   Pulse 64   Ht 1.829 m (6')   Wt 93.4 kg (206 lb)   BMI 27.94 kg/m2        PATIENT DENIES: [x]  Headaches. [x]  Chest Pain. [x]  Palpitations. [x]  Shortness of Breath.    HABITS:   --Sodium-   [] Yes  [x] No          --Exercise-  [x] Yes 3x week at [facility]  [] No          --Smoking-  [] Yes  [x] No          --Caffeine Use- [x] Yes 1 diet Pepsi/d  [] No             HOME BLOOD PRESSURE: not taking.  On no meds.    He has not had a migraine in ~1 year. Triggers are stress/fatigue. He only gets 5-6 hours sleep.  Has trouble chronically falling asleep. He has tried 5mg  Melatonin sporadically which he thinks may have helped. He feels tired ~4-7PM.  He exercises at 7:30PM.    He has been at his new job ~4 years and likes it.  He does have job stress but is able to diffuse it doing exercise/yard work. Uses Motrin if he gets a migraine    EXAM:    AFFECT:  Pleasant and cheerful in NAD  HEART: Regular rate and rhythm, no murmurs gallops or rubs.  LUNGS: Clear to auscultation bilaterally.    ASSESSMENT/PLAN:    1.  [id number] well-controlled and at goal of less than 140/90.  Diet and exercise reviewed.  Currently on no medicine  2.  Chronic insomnia: Sleep hygiene handout given and extensively reviewed.  He has sporadically use melatonin the past and thinks it helps.  Okay to use melatonin as needed.  Call if no better  3.  Migraine headaches: Unfortunately, he has not had one in over a year.  We did discuss triggers and how to modify them.  Taking Advil/Motrin usually helps if he has one.  4.  Health maintenance: We did discuss the new shingles vaccine.  They're currently is a back order.  He will go on our list.      RETURN: 4 months  HTN

## 2017-09-05 ENCOUNTER — Ambulatory Visit: Payer: Commercial Managed Care - PPO | Attending: Primary Care

## 2017-09-05 DIAGNOSIS — Z23 Encounter for immunization: Secondary | ICD-10-CM

## 2017-09-05 NOTE — Progress Notes (Signed)
Patient presented for #1 of his Shingrix today.  Given and tolerated well.  Will return in 2-6 months for #2.  Side effects reviewed.  VIS given and questions answered

## 2017-10-06 ENCOUNTER — Encounter: Payer: Self-pay | Admitting: Primary Care

## 2017-10-06 ENCOUNTER — Ambulatory Visit: Payer: Commercial Managed Care - PPO | Attending: Primary Care | Admitting: Primary Care

## 2017-10-06 VITALS — BP 124/84 | HR 72 | Ht 72.0 in | Wt 202.0 lb

## 2017-10-06 DIAGNOSIS — I10 Essential (primary) hypertension: Secondary | ICD-10-CM

## 2017-10-06 NOTE — Progress Notes (Signed)
Chief Complaint   Patient presents with    Hypertension     HYPERTENSION MANAGEMENT:     BP 132/82    Pulse 72    Ht 1.829 m (6')    Wt 91.6 kg (202 lb)    BMI 27.40 kg/m    Vitals:    10/06/17 0832   BP: 124/84   Pulse:    Weight:    Height:         PATIENT DENIES: [x]  Headaches. [x]  Chest Pain. [x]  Palpitations. [x]  Shortness of Breath.    SYMPTOMS:     HABITS:   --Sodium-   [] Yes  [x] No          --Exercise-  [x] Yes [employer] 3x week  [] No          --Smoking-  [] Yes  [x] No          --Caffeine Use- [x] Yes 1 diet Pepsi/d [] No             HOME BLOOD PRESSURE: not taking    EXAM:    HEART: Regular rate and rhythm, no murmurs gallops or rubs.  LUNGS: Clear to auscultation bilaterally.     ASSESSMENT/PLAN:    1. HTN:  Presently well-controlled and at goal of less than 140/90.  Diet and exercise reviewed.  Currently on no medicine  2.  Health maintenance: Patient is scheduled to get his second Shingrix next month    RETURN: 4 months HTN

## 2017-11-05 ENCOUNTER — Ambulatory Visit: Payer: No Typology Code available for payment source | Attending: Primary Care

## 2017-11-05 DIAGNOSIS — Z23 Encounter for immunization: Secondary | ICD-10-CM

## 2017-11-05 NOTE — Progress Notes (Signed)
Patient presented for Shingrix #2.  Was given and tolerated well.  Minimal side effects with first vaccine, only c/o sore arm.  VIS given and questions answered.

## 2018-02-09 ENCOUNTER — Ambulatory Visit: Payer: No Typology Code available for payment source | Attending: Primary Care | Admitting: Primary Care

## 2018-02-09 ENCOUNTER — Encounter: Payer: Self-pay | Admitting: Primary Care

## 2018-02-09 VITALS — BP 116/66 | HR 60 | Ht 72.01 in | Wt 203.4 lb

## 2018-02-09 DIAGNOSIS — M545 Low back pain, unspecified: Secondary | ICD-10-CM

## 2018-02-09 DIAGNOSIS — I10 Essential (primary) hypertension: Secondary | ICD-10-CM

## 2018-02-09 DIAGNOSIS — Z125 Encounter for screening for malignant neoplasm of prostate: Secondary | ICD-10-CM

## 2018-02-09 NOTE — NoShare Progress Note (Signed)
Chief Complaint   Patient presents with    Hypertension     HYPERTENSION MANAGEMENT:     BP 116/66    Pulse 60    Ht 1.829 m (6' 0.01")    Wt 92.3 kg (203 lb 6.4 oz)    BMI 27.58 kg/m         PATIENT DENIES: [x]  Headaches. [x]  Chest Pain. [x]  Palpitations. [x]  Shortness of Breath.    Lost 32 lbs on purpose since 08/2015    HABITS:   --Sodium-   [] Yes  [x] No          --Exercise-  [x] Yes Elliptical 3x week 8 miles each time [] No          --Smoking-  [] Yes  [x] No          --Caffeine Use- [x] Yes 1-2 diet Pepsi/d  [] No           HOME BLOOD PRESSURE: not taking    He has had 2 days mid-lower, dull lumbar pain.  No injury. Sitting is worse.  Took Aleve yesterday which helped. No sciatica.    EXAM:     HEART: Regular rate and rhythm, no murmurs gallops or rubs.  LUNGS: Clear to auscultation bilaterally.  LUMBAR BACK: no palpable pain. FROM except mild pain with left lateral flexion    ASSESSMENT/PLAN:    1.  ZOX:WRUEAVWUJHTN:Presently well-controlled and at goal of less than 140/90.  Diet and exercise reviewed.  He is currently on no medication.  Check SMA-8  2.  Lumbar back pain/strain: Avoid triggering activities and positions.  Okay to use Aleve for up to one week.  Heat as needed.  Call if no better/worse  3.  Health maintenance: Order placed to check screening PSA    RETURN: 5 months HTN and the next available CME

## 2018-07-13 ENCOUNTER — Ambulatory Visit: Payer: No Typology Code available for payment source | Attending: Primary Care | Admitting: Primary Care

## 2018-07-13 ENCOUNTER — Encounter: Payer: Self-pay | Admitting: Primary Care

## 2018-07-13 VITALS — BP 122/76 | HR 72 | Ht 72.0 in | Wt 203.6 lb

## 2018-07-13 DIAGNOSIS — I10 Essential (primary) hypertension: Secondary | ICD-10-CM

## 2018-07-13 NOTE — NoShare Progress Note (Signed)
Chief Complaint   Patient presents with    Hypertension     HYPERTENSION MANAGEMENT:     BP 122/76    Pulse 72    Ht 1.829 m (6')    Wt 92.4 kg (203 lb 9.6 oz)    BMI 27.61 kg/m         PATIENT DENIES: [x]  Headaches. [x]  Chest Pain. [x]  Palpitations. [x]  Shortness of Breath.    Had a lot of stress over the summer-55 year old daughter has anorexia and had to be hospitalized for [place]orexia in [place] for 8 weeks.     SYMPTOMS:     HABITS:   --Sodium-   [] Yes  [x] No          --Exercise-  [x] Yes 1x month  [] No          --Smoking-  [] Yes  [x] No          --Caffeine Use- [x] Yes 1-2 diet Pepsi/d [] No             HOME BLOOD PRESSURE: not taking    EXAM:    HEART: Regular rate and rhythm, no murmurs gallops or rubs.  LUNGS: Clear to auscultation bilaterally.       ASSESSMENT/PLAN:    1. HTN:  Presently well-controlled and at goal of less than 140/90.  Diet and exercise reviewed.  Currently on no medicine.  2.  Health maintenance: Patient plans on getting a flu shot at work    RETURN:  12/07/18 CME

## 2018-12-07 ENCOUNTER — Ambulatory Visit: Payer: No Typology Code available for payment source | Attending: Primary Care | Admitting: Primary Care

## 2018-12-07 ENCOUNTER — Encounter: Payer: Self-pay | Admitting: Primary Care

## 2018-12-07 VITALS — BP 122/80 | HR 68 | Ht 72.01 in | Wt 213.8 lb

## 2018-12-07 DIAGNOSIS — G43909 Migraine, unspecified, not intractable, without status migrainosus: Secondary | ICD-10-CM

## 2018-12-07 DIAGNOSIS — I10 Essential (primary) hypertension: Secondary | ICD-10-CM

## 2018-12-07 DIAGNOSIS — Z0000 Encounter for general adult medical examination without abnormal findings: Secondary | ICD-10-CM

## 2018-12-07 LAB — PCMH DEPRESSION ASSESSMENT

## 2018-12-07 MED ORDER — SILDENAFIL CITRATE 20 MG PO TABS *I*
ORAL_TABLET | ORAL | 0 refills | Status: DC
Start: 2018-12-07 — End: 2020-10-09

## 2018-12-07 NOTE — H&P (Signed)
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    Possible Restless leg syndrome G25.81        Past Medical/Surgical History:   Past Medical History:   Diagnosis Date    Hyperlipidemia     Conversion Data - ^Resolved    Lateral epicondylitis  of elbow 04/13/2014    Pneumonia     Conversion Data - Jenna Luo^Resolved    Unspecified closed fracture of ankle     Conversion Data - Jenna Luo^Resolved     Past Surgical History:   Procedure Laterality Date    HX TONSILLECTOMY/ADENOIDECTOMY      Tonsillectomy Conversion Data          Allergies:  No Known Allergies (drug, envir, food or latex)    Current medications:    No current outpatient medications on file.       Family History:    Family History   Problem Relation Age of Onset    Diabetes Father     Lung cancer Mother [id number]    Conversion Other         [id number]^Family Health Status^^Active^Father(A)AODM Mother(A) 2 Brothers(A)HTN 1 Sister(A)Graves.Crohns  Osteochondroma 2 Sons(A)1 Allergies 1 Daughter(A) PatGF(D)57.MI PatGM(D)68.Pneumonia due to hip frx.COPD MatGF(D)60s.CVA MatGM(D)80s.unsure.    Hypertension Brother     Hypertension Brother     OCD Daughter     Anorexia nervosa Daughter        Social/Occupational History:   Social History     Socioeconomic History    Marital status: Married     Spouse name: Not on file    Number of children: 3    Years of education: Not on file    Highest education level: Not on file   Tobacco Use    Smoking status: Never Smoker    Smokeless tobacco: Never Used   Substance and Sexual Activity    Alcohol use: Yes     Alcohol/week: 0.0 standard drinks     Comment: Social use    Drug use: No    Sexual activity: Yes      Partners: Female   Other Topics Concern    Not on file   Social History Narrative    Not on file         Review of Systems:    Review of Systems   Constitutional: Negative for chills, fever, malaise/fatigue and weight loss.        Normal appetite.  No night sweats.   HENT: Negative for congestion, ear pain, hearing loss, nosebleeds, sore throat and tinnitus.         No rhinitis   Eyes: Negative for blurred vision, double vision, pain, discharge and redness.        Wears glasses   Respiratory: Negative for cough, hemoptysis, shortness of breath and wheezing.    Cardiovascular: Negative for chest pain, palpitations, orthopnea and leg swelling.   Gastrointestinal: Negative for abdominal pain, blood in stool, constipation, diarrhea, heartburn, melena, nausea and vomiting.        No Dysphagia   Genitourinary:  Negative for dysuria, flank pain, frequency, hematuria and urgency.        Rare Nocturia.  Mild weaker stream.  No penile sores/discharge.  No testicle pain/masses.  No hernias.  Has psychgenic erectile dysfunction due to stress of sister dying last year and with his daughter's eating disorder. .   Musculoskeletal: Negative for back pain, falls, joint pain, myalgias and neck pain.   Skin: Negative for itching and rash.        No mole changes   Neurological: Negative for dizziness, tingling, sensory change, focal weakness, loss of consciousness, weakness and headaches.   Endo/Heme/Allergies: Negative for polydipsia. Does not bruise/bleed easily.        No adenopathy.   Psychiatric/Behavioral: Negative for depression and memory loss. The patient has insomnia (episodic). The patient is not nervous/anxious.    NUTRITION:     -Caffeine: 2 diet Pepsi/d   -Salt: no salt   -Cholesterol: fast food 1x every 2 months, sweets in moderation, drinks no milk, eats 3 eggs/week    SAFETY:     -Seatbelt: yes   -Safe Sex: no affairs    SELF TESTICLE EXAM (MALE): not taking    EXERCISE: 2x week Elliptical (was 3-4x week)    PROXY:  given         Vital Signs:   BP 122/80    Pulse 68    Ht 1.829 m (6' 0.01")    Wt 97 kg (213 lb 12.8 oz)    BMI 28.99 kg/m     EKG:  NSR 57.  Old Q/slight inverted T III .  No change from 03/07/16    PHYSICAL EXAM:  Physical Exam   Constitutional: He is oriented to person, place, and time. He appears well-developed and well-nourished. No distress.   HENT:   Head: Normocephalic.   Right Ear: Hearing, tympanic membrane, external ear and ear canal normal.   Left Ear: Hearing, tympanic membrane, external ear and ear canal normal.   Nose: Nose normal.   Mouth/Throat: Uvula is midline, oropharynx is clear and moist and mucous membranes are normal. No oral lesions. Normal dentition. No oropharyngeal exudate.   Eyes: Pupils are equal, round, and reactive to light. Conjunctivae, EOM and lids are normal. Right eye exhibits no discharge. Left eye exhibits no discharge. No scleral icterus.   Neck: Normal range of motion. Neck supple. Carotid bruit is not present. No thyroid mass and no thyromegaly present.   Cardiovascular: Normal rate, regular rhythm, normal heart sounds and intact distal pulses. Exam reveals no gallop and no friction rub.   No murmur heard.  Pulmonary/Chest: Effort normal and breath sounds normal. No respiratory distress. He has no wheezes. He has no rales.   Abdominal: Soft. Bowel sounds are normal. He exhibits no distension, no abdominal bruit, no pulsatile midline mass and no mass. There is no splenomegaly or hepatomegaly. There is no abdominal tenderness. There is no rebound and no guarding. No hernia. Hernia confirmed negative in the right inguinal area and confirmed negative in the left inguinal area.   Genitourinary:    Testes and penis normal.   Rectum:      Guaiac result negative.      No external hemorrhoid or abnormal anal tone.   Prostate is enlarged (and smooth).   Musculoskeletal: Normal range of motion.         General: No tenderness or edema.      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 He displays normal reflexes. No cranial nerve deficit. He exhibits normal muscle tone. He displays a negative Romberg sign. Coordination and gait normal.   Skin: Skin is warm and dry. No rash noted. No cyanosis. Nails show no clubbing.   No mole changes   Psychiatric: He has a normal mood and affect.           Assessment:    [name] [name] was seen today for annual exam.    Diagnoses and all orders for this visit:    Annual physical exam  -     Comprehensive metabolic panel; Future  -     POCT Occult Blood Stool  -     Lipid Panel (Reflex to Direct  LDL if Triglycerides more than 400); Future  -     CBC and differential; Future  -     PSA (eff.01-2009); Future  -     Urinalysis with reflex to microscopic; Future    Migraine, unspecified, not intractable, without status migrainosus    Hypertension, unspecified type  -     Comprehensive metabolic panel; Future  -     POCT Occult Blood Stool  -     Lipid Panel (Reflex to Direct  LDL if Triglycerides more than 400); Future  -     CBC and differential; Future  -     PSA (eff.01-2009); Future  -     Urinalysis with reflex to microscopic; Future  -     EKG 12 lead; Future       .      Plan:           Assessment/Plan:    1.  DHW:YSHUOHFGB well-controlled and at goal of less than 140/90.  Diet and exercise reviewed.  Currently on no medicine  2.  Migraine headaches: Currently asymptomatic.  Continue trying to modify his triggers which are stress and fatigue.  Ibuprofen as needed  3.  Probable Restless leg syndrome: Currently stable on no medicine  4.  Moles: Continue self checking himself and using sunscreen  5.  Osteoarthritis cervical spine: Currently asymptomatic.  Avoid triggering activities and positions  6.  Probable right lateral meniscus tear: Asymptomatic.  Avoid triggering activities and positions  7.  Psychogenic erectile dysfunction: Secondary to the stress he has endured the past year with his  sister dying and with his daughter's eating disorder.  He requested medication.  I gave him a prescription for generic Viagra 20 mg 1-5 tablets daily as needed 1 hour before intercourse.  We did review the proper way to take it as well as side effects.  8.  Labs: SMAC/FLP/CBC with differential/PSA/UA    RETURN: 6 months HTN

## 2019-01-18 ENCOUNTER — Telehealth: Payer: Self-pay | Admitting: Primary Care

## 2019-01-18 ENCOUNTER — Ambulatory Visit: Payer: No Typology Code available for payment source | Attending: Primary Care | Admitting: Primary Care

## 2019-01-18 DIAGNOSIS — R142 Eructation: Secondary | ICD-10-CM

## 2019-01-18 DIAGNOSIS — R0789 Other chest pain: Secondary | ICD-10-CM

## 2019-01-18 DIAGNOSIS — R197 Diarrhea, unspecified: Secondary | ICD-10-CM

## 2019-01-18 NOTE — NoShare Progress Note (Signed)
Video Visit     Location of Patient: home    Location of Telemedicine Provider: clinical office        Reason for visit: No chief complaint on file.      HPI    On 12/31/18, he picked up his son in [place] who flew in from [place]. Son had no symptoms of Covid-19 but his PCP advised he and father self quarantine x 14 days which they did. Patient had diarrhea only 1 day last Wednesday. 2 nights ago, he felt some tightness in mid sternum. It was not intense and did radiate to left scapula. Had some belching and too Tums which helped. Denies dietary indiscretion. No N/V. No f/c/cough/SOB. No rash. Yesterday he had occasional tightness in chest and today it is gone. He has been under a lot of stress working from home 14 hour days the past 10 days.  He had a negative Exercise Echo 06/2014.       Patient's problem list, allergies, and medications were reviewed and updated as appropriate.  Please see the EHR for full details.    Exam    AFFECT:  Pleasant and cheerful in NAD    Assessment & Plan:    1.  Chest tightness: Atypical and possibly related to work stress/reflux.  Tums did alleviate his symptoms.  We did discuss however if symptoms persist/worsen that he should go to the ER to rule out a cardiac source.  He will try to take some more work breaks to relax and go for walks for exercise.  Call if no better/worse    Consent was obtained from the patient to complete this video visit; including the potential for financial liability.    RETURN: 06/07/19 HTN

## 2019-01-18 NOTE — Telephone Encounter (Signed)
Patient's son was in [place] for college, patient picked up his son in [place] on 01/09/2019. Patient's son's doctor recommended patient's son self quarantine for 14 days. Patient and his son both self quarantined for 14 days. Patient stated within the last two days he has had chest tightness. Patient denies any travel other than over 14 days ago with his son. He also denies a fever or cough. Patient states his chest tightness is minimal but he is still concerned. He stated he is not feeling short of breath just having chest tightness. Patient scheduled for a Zoom Visit with Dr. Einar Gip at 10:30AM.

## 2019-06-07 ENCOUNTER — Encounter: Payer: Self-pay | Admitting: Primary Care

## 2019-06-07 ENCOUNTER — Ambulatory Visit: Payer: No Typology Code available for payment source | Admitting: Primary Care

## 2019-06-07 VITALS — BP 120/60 | HR 76 | Temp 97.8°F | Ht 72.0 in | Wt 218.6 lb

## 2019-06-07 DIAGNOSIS — I10 Essential (primary) hypertension: Secondary | ICD-10-CM

## 2019-06-07 NOTE — NoShare Progress Note (Signed)
Chief Complaint   Patient presents with    Hypertension     HYPERTENSION MANAGEMENT:     BP 120/60    Pulse 76    Temp 36.6 C (97.8 F)    Ht 1.829 m (6')    Wt 99.2 kg (218 lb 9.6 oz)    SpO2 98%    BMI 29.65 kg/m      Gained 15 lbs since 06/2018-snacks more and eats more at lunch     PATIENT DENIES: [x]  Chest Pain. [x]  Palpitations. [x]  Shortness of Breath.    SYMPTOMS:  had a migraine ~2 months ago due to lack of sleep.     HABITS:   --Sodium-   [] Yes  [x] No          --Exercise-  [x] Yes walks dog daily. Gym is closed during the pandemic  [] No          --Smoking-  [] Yes  [x] No          --Caffeine Use- [x] Yes 1-2 diet Pepsi a day  [] No             HOME BLOOD PRESSURE: not taking    EXAM:    HEART: Regular rate and rhythm, no murmurs gallops or rubs.  LUNGS: Clear to auscultation bilaterally.     ASSESSMENT/PLAN:    1. HTN:  Presently well-controlled and at goal of less than 140/90.  Diet and exercise reviewed  2. Health Maintenance: he was reminded to do his CME labs    RETURN: 6 months HTN

## 2019-11-19 ENCOUNTER — Other Ambulatory Visit
Admission: RE | Admit: 2019-11-19 | Discharge: 2019-11-19 | Disposition: A | Discharge status: Home / Self Care | Payer: No Typology Code available for payment source | Source: Ambulatory Visit | Attending: Primary Care | Admitting: Primary Care

## 2019-11-19 DIAGNOSIS — I10 Essential (primary) hypertension: Secondary | ICD-10-CM | POA: Insufficient documentation

## 2019-11-19 DIAGNOSIS — Z0000 Encounter for general adult medical examination without abnormal findings: Secondary | ICD-10-CM | POA: Insufficient documentation

## 2019-11-19 LAB — URINALYSIS WITH REFLEX TO MICROSCOPIC
Blood,UA: NEGATIVE
Glucose,UA: NEGATIVE mg/dL
Ketones, UA: NEGATIVE
Leuk Esterase,UA: NEGATIVE
Nitrite,UA: NEGATIVE
Protein,UA: NEGATIVE mg/dL
Specific Gravity,UA: 1.018 (ref 1.002–1.030)
pH,UA: 6 (ref 5.0–8.0)

## 2019-11-19 LAB — LIPID PANEL
Chol/HDL Ratio: 4.3
Cholesterol: 202 mg/dL — AB
HDL: 47 mg/dL (ref 40–60)
LDL Calculated: 139 mg/dL — AB
Non HDL Cholesterol: 155 mg/dL
Triglycerides: 81 mg/dL

## 2019-11-19 LAB — COMPREHENSIVE METABOLIC PANEL
ALT: 25 U/L (ref 0–50)
AST: 24 U/L (ref 0–50)
Albumin: 4.8 g/dL (ref 3.5–5.2)
Alk Phos: 73 U/L (ref 40–130)
Anion Gap: 11 (ref 7–16)
Bilirubin,Total: 0.8 mg/dL (ref 0.0–1.2)
CO2: 25 mmol/L (ref 20–28)
Calcium: 9.9 mg/dL (ref 8.6–10.2)
Chloride: 104 mmol/L (ref 96–108)
Creatinine: 1.38 mg/dL — ABNORMAL HIGH (ref 0.67–1.17)
GFR,Black: 65 *
GFR,Caucasian: 56 * — AB
Glucose: 94 mg/dL (ref 60–99)
Lab: 21 mg/dL — ABNORMAL HIGH (ref 6–20)
Potassium: 4.9 mmol/L (ref 3.3–5.1)
Sodium: 140 mmol/L (ref 133–145)
Total Protein: 6.7 g/dL (ref 6.3–7.7)

## 2019-11-19 LAB — PSA (EFF.4-2010): PSA (eff. 4-2010): 0.35 ng/mL (ref 0.00–4.00)

## 2019-11-23 ENCOUNTER — Encounter: Payer: Self-pay | Admitting: Primary Care

## 2019-11-23 ENCOUNTER — Ambulatory Visit: Payer: No Typology Code available for payment source | Admitting: Primary Care

## 2019-11-23 VITALS — BP 138/84 | HR 64 | Temp 97.0°F | Ht 72.0 in | Wt 222.8 lb

## 2019-11-23 DIAGNOSIS — R7989 Other specified abnormal findings of blood chemistry: Secondary | ICD-10-CM

## 2019-11-23 DIAGNOSIS — S76312A Strain of muscle, fascia and tendon of the posterior muscle group at thigh level, left thigh, initial encounter: Secondary | ICD-10-CM

## 2019-11-23 DIAGNOSIS — E785 Hyperlipidemia, unspecified: Secondary | ICD-10-CM

## 2019-11-23 DIAGNOSIS — I10 Essential (primary) hypertension: Secondary | ICD-10-CM

## 2019-11-23 NOTE — Progress Notes (Signed)
Chief Complaint   Patient presents with    Hypertension    Leg Pain     Left Upper Leg    Hyperlipidemia    Abnormal Lab     elevated Creatinine     Current Outpatient Medications   Medication Sig Dispense Refill    sildenafil (REVATIO) 20 MG tablet Take 1-5 Tablets Daily as needed 1 Hour prior to sex 50 tablet 0     No current facility-administered medications for this visit.      CHOLESTEROL MANAGEMENT: Chronic and Stable      BP 138/84    Pulse 64    Temp 36.1 C (97 F)    Ht 1.829 m (6')    Wt 101.1 kg (222 lb 12.8 oz)    SpO2 99%    BMI 30.22 kg/m      DIET:       --Eggs:   '[x]' Yes 2-3/week            --Fast Food:  '[]' Yes  '[x]' No          --Junk Food:  '[x]' Yes sweets 2x week (Peanut M&Ms or Baked good)            --Milk:   Drinks Almond milk with cereal        --Butter:   Rare real or margarine       --Fiber:   '[x]' Yes 4-5 servings fruit/veges a day  '[]' No        EXERCISE:   Just got a Peloton Bike last week-will use it 3-4x week. No longer goes to the [facility] due to the pandemic    MEDICATIONS:  None    HYPERTENSION MANAGEMENT:  Chronic and Stable     PATIENT DENIES: '[x]'  Headaches. '[x]'  Chest Pain. '[x]'  Palpitations. '[x]'  Shortness of Breath.    HABITS:   --Sodium-   '[]' Yes  '[x]' No                  --Smoking-  '[]' Yes  '[x]' No          --Caffeine Use- '[x]' Yes 2 diet Pepsi/d              HOME BLOOD PRESSURE: not taking    He has been having left hamstring pain on and off x 1 month. It usually occurs when sleeping>sitting. It can at times cause trouble sleeping. It does not hurt to exercise. No edema. No recent injury/long trips. Took no pain meds.     Recent Results (from the past 336 hour(s))   Urinalysis with reflex to microscopic    Collection Time: 11/19/19  7:55 AM   Result Value Ref Range    Color, UA Yellow Yellow-Dark Yellow    Appearance,UR Clear Clear    Specific Gravity,UA 1.018 1.002 - 1.030    Leuk Esterase,UA NEG NEGATIVE    Nitrite,UA NEG NEGATIVE    pH,UA 6.0 5.0 - 8.0    Protein,UA NEG NEGATIVE mg/dL     Glucose,UA NEG NEGATIVE mg/dL    Ketones, UA NEG NEGATIVE    Blood,UA NEG NEGATIVE   PSA (eff.01-2009)    Collection Time: 11/19/19  7:55 AM   Result Value Ref Range    PSA (eff. 01-2009) 0.35 0.00 - 4.00 ng/mL   Lipid Panel (Reflex to Direct  LDL if Triglycerides more than 400)    Collection Time: 11/19/19  7:55 AM   Result Value Ref Range    Cholesterol 202 (!) mg/dL    Triglycerides 81 mg/dL  HDL 47 40 - 60 mg/dL    LDL Calculated 139 (!) mg/dL    Non HDL Cholesterol 155 mg/dL    Chol/HDL Ratio 4.3    Comprehensive metabolic panel    Collection Time: 11/19/19  7:55 AM   Result Value Ref Range    Sodium 140 133 - 145 mmol/L    Potassium 4.9 3.3 - 5.1 mmol/L    Chloride 104 96 - 108 mmol/L    CO2 25 20 - 28 mmol/L    Anion Gap 11 7 - 16    UN 21 (H) 6 - 20 mg/dL    Creatinine 1.38 (H) 0.67 - 1.17 mg/dL    GFR,Caucasian 56 (!) *    GFR,Black 65 *    Glucose 94 60 - 99 mg/dL    Calcium 9.9 8.6 - 10.2 mg/dL    Total Protein 6.7 6.3 - 7.7 g/dL    Albumin 4.8 3.5 - 5.2 g/dL    Bilirubin,Total 0.8 0.0 - 1.2 mg/dL    AST 24 0 - 50 U/L    ALT 25 0 - 50 U/L    Alk Phos 73 40 - 130 U/L      Recent Cr was 1.38 (fasting). Has 3-4 cups water a day. No recent NSAIDS. Does not lift weights. No protein powder.     EXAM:    HEART: Regular rate and rhythm, no murmurs gallops or rubs.  LUNGS: Clear to auscultation bilaterally.  LEFT HAMSTRING: no palpable pain. Negative SLR  LEFT CALF: no swelling and negative Homan's sign     ASSESSMENT/PLAN:    1. HTN: Borderline but currently at goal of less than 140/90.  He is currently on no medicine.  Diet and exercise reviewed  2. Hyperlipidemia: Recent profile reviewed which is worse than his last profile when he was intensely exercising.  Because of the pandemic, he has not been exercising at the [facility] but recently bought a Peloton bike.  He is currently on no medicine.  Diet and exercise reviewed.  We will recheck lipid profile at next visit  3. Left Hamstring Strain: Unclear cause.  Okay to  use heat/OTC Theraworx spray.  I also shorten some stretches to try, especially before exercising.  He was advised to call if no better/worse  4. Elevated Creatinine: He will try to increase water.  Recheck nonfasting SMA 8 in about a week    RETURN: 4 months HTN/Cholesterol

## 2019-11-29 ENCOUNTER — Ambulatory Visit: Payer: No Typology Code available for payment source | Admitting: Primary Care

## 2019-11-30 ENCOUNTER — Other Ambulatory Visit
Admission: RE | Admit: 2019-11-30 | Discharge: 2019-11-30 | Disposition: A | Discharge status: Home / Self Care | Payer: No Typology Code available for payment source | Source: Ambulatory Visit | Attending: Primary Care | Admitting: Primary Care

## 2019-11-30 DIAGNOSIS — R7989 Other specified abnormal findings of blood chemistry: Secondary | ICD-10-CM | POA: Insufficient documentation

## 2019-11-30 LAB — BASIC METABOLIC PANEL
Anion Gap: 9 (ref 7–16)
CO2: 27 mmol/L (ref 20–28)
Calcium: 10 mg/dL (ref 8.6–10.2)
Chloride: 102 mmol/L (ref 96–108)
Creatinine: 1.22 mg/dL — ABNORMAL HIGH (ref 0.67–1.17)
GFR,Black: 76 *
GFR,Caucasian: 66 *
Glucose: 89 mg/dL (ref 60–99)
Lab: 17 mg/dL (ref 6–20)
Potassium: 4.5 mmol/L (ref 3.3–5.1)
Sodium: 138 mmol/L (ref 133–145)

## 2020-03-22 ENCOUNTER — Ambulatory Visit: Payer: No Typology Code available for payment source | Admitting: Primary Care

## 2020-03-22 ENCOUNTER — Encounter: Payer: Self-pay | Admitting: Primary Care

## 2020-03-22 VITALS — BP 116/68 | HR 70 | Temp 97.9°F | Ht 72.0 in | Wt 216.8 lb

## 2020-03-22 DIAGNOSIS — E785 Hyperlipidemia, unspecified: Secondary | ICD-10-CM

## 2020-03-22 DIAGNOSIS — I10 Essential (primary) hypertension: Secondary | ICD-10-CM

## 2020-03-22 NOTE — Progress Notes (Signed)
Chief Complaint   Patient presents with    Hypertension    Hyperlipidemia     Current Outpatient Medications   Medication Sig Dispense Refill    sildenafil (REVATIO) 20 MG tablet Take 1-5 Tablets Daily as needed 1 Hour prior to sex 50 tablet 0     No current facility-administered medications for this visit.     HYPERTENSION MANAGEMENT:  Chronic and Stable. Lost 6 lbs with diet/exercise. Has stress at home with father/daughters health issues    BP 116/68    Pulse 70    Temp 36.6 C (97.9 F)    Ht 1.829 m (6')    Wt 98.3 kg (216 lb 12.8 oz)    SpO2 97%    BMI 29.40 kg/m         PATIENT DENIES: [x]  Headaches. [x]  Chest Pain. [x]  Palpitations. [x]  Shortness of Breath.    SYMPTOMS:     HABITS:   --Sodium-   [] Yes  [x] No          --Exercise-  [x] Yes Peloton 3x week            --Smoking-  [] Yes  [x] No          --Caffeine Use- [x] Yes 2 diet Pepsi a day               HOME BLOOD PRESSURE: not taking    EXAM:    HEART: Regular rate and rhythm, no murmurs gallops or rubs.  LUNGS: Clear to auscultation bilaterally.    ASSESSMENT/PLAN:    1. HTN:   Presently well-controlled and at goal of less than 140/90.  Diet and exercise reviewed. On no meds  2. Hyperlipidemia: His last profile was borderline elevated.  He is currently on no medicine.  Recheck lipid profile    RETURN: 6 months HTN/cholesterol/check PSA

## 2020-07-20 ENCOUNTER — Other Ambulatory Visit
Admission: RE | Admit: 2020-07-20 | Discharge: 2020-07-20 | Disposition: A | Discharge status: Home / Self Care | Payer: No Typology Code available for payment source | Source: Ambulatory Visit | Attending: Primary Care | Admitting: Primary Care

## 2020-07-20 ENCOUNTER — Encounter: Payer: Self-pay | Admitting: Primary Care

## 2020-07-20 DIAGNOSIS — E785 Hyperlipidemia, unspecified: Secondary | ICD-10-CM | POA: Insufficient documentation

## 2020-07-20 LAB — LIPID PANEL
Chol/HDL Ratio: 3.8
Cholesterol: 184 mg/dL
HDL: 49 mg/dL (ref 40–60)
LDL Calculated: 120 mg/dL
Non HDL Cholesterol: 135 mg/dL
Triglycerides: 73 mg/dL

## 2020-09-20 ENCOUNTER — Ambulatory Visit: Payer: No Typology Code available for payment source | Admitting: Primary Care

## 2020-10-05 ENCOUNTER — Ambulatory Visit
Admission: AD | Admit: 2020-10-05 | Discharge: 2020-10-05 | Disposition: A | Discharge status: Home / Self Care | Payer: No Typology Code available for payment source | Source: Ambulatory Visit | Attending: Family | Admitting: Family

## 2020-10-05 DIAGNOSIS — Z20828 Contact with and (suspected) exposure to other viral communicable diseases: Secondary | ICD-10-CM | POA: Insufficient documentation

## 2020-10-05 DIAGNOSIS — Z20822 Contact with and (suspected) exposure to covid-19: Secondary | ICD-10-CM | POA: Insufficient documentation

## 2020-10-05 DIAGNOSIS — J029 Acute pharyngitis, unspecified: Secondary | ICD-10-CM | POA: Insufficient documentation

## 2020-10-05 DIAGNOSIS — B349 Viral infection, unspecified: Secondary | ICD-10-CM | POA: Insufficient documentation

## 2020-10-05 LAB — POCT AMBULATORY RAPID STREP
Lot #: 211277
Rapid Strep Group A Throat-POC: NEGATIVE

## 2020-10-05 NOTE — Discharge Instructions (Signed)
Warm Salt water gargles can help alleviate throat discomfort  Cold drinks and food such as popsicles, ice chips, ice cream, and milkshakes can help soothe throat and reduce inflammation  Please continue to hydrate aggressively and get plenty of rest  Can try throat lozenges or sprays for comfort such as Cepacol or Chloraspetic spray  Ibuprofen 400-600mg can be taken every 6 hours for the next 3 days. Take with food to avoid stomach upset    Your rapid strep test was negative; we will send your throat culture to the lab for a more sensitive test. If this is positive you will hear from us and order an antibiotic for you.     Mucinex 600mg can help to break up mucous and may be easier to cough it up. You can take this every 12 hours for the next 3-5 days  Flonase nasal spray is helpful for nasal congestion and post nasal drip   Saline nasal spray every 4 hours for the next 2-5 days    If you develop any worsening sore throat that is not resolving, shortness of breath, chest pain, worsening fevers, difficulty swallowing, or unable to tolerate your own secretions please go to the Emergency Department to be evaluated.

## 2020-10-05 NOTE — ED Triage Notes (Signed)
Pt c/o sore throat, runny nose, headache, and body aches since last night. Took tylenol this am w/ some relief. Negative at home covid test.

## 2020-10-05 NOTE — UC Provider Note (Signed)
Chief Complaint:   Chief Complaint   Patient presents with    Sore Throat        HPI:  59 y.o. male w hx as noted in chart presents to Urgent Care with symptoms concerning for COVID 19. Patient complains of 1 day(s) of the following symptoms: Sore throat, nasal congestion, rhinorrhea and myalgias with headache  Symptoms have remained the same     Known sick contacts / COVID 19 exposures: no  Recent travel: no  COVID 19 vaccination completed >2 weeks ago: yes    VITALS:  BP 135/82 (BP Location: Left arm)    Pulse 77    Temp 36.8 C (98.2 F) (Temporal)    Resp 16    SpO2 98%      ROS: see above / below, otherwise complete ROS negative   Fever: no  Body aches: yes  Upper respiratory congestion: yes  Shortness of breath: no  Cough: no     PHYSICAL EXAM:  Appearance: Well appearing, no acute distress   Nose: clear rhinorrhea and mucosal edema and congestion   Throat: normal, red -mild posterior oropharyngeal erythema without bilateral patchy exudate, PTA or tonsillar stones.  Airway patent and uvula midline.  Tolerating oral secretions without difficulty.   Neck: no cervical LAD or tenderness, soft  CV: RRR, no murmurs   Pulm: Lungs Clear to ausculation bilaterally, no acute respiratory distress     UC LABS:  Labs Reviewed   STREP A CULTURE, THROAT   COVID-19 PCR   POCT AMBULATORY RAPID STREP       Results for orders placed or performed during the hospital encounter of 10/05/20   POCT rapid strep   Result Value Ref Range    Rapid Strep Group A Throat-POC Negative for Streptococcus Group A Antigen Negative    INTERNAL CONTROL RAPID STREP POCT *Yes-internal procedural control(s) acceptable     Exp date 12/2021     Lot # [id number]         MDM:  60 y.o. male w hx as noted in chart presents with symptoms concerning for COVID-19 vs other mild viral illness. COVID 19 PCR testing ordered (see above if other labs ordered). The patient was informed the COVID 19 result will be available in MyChart and the need for isolation per CDC  guidelines. Recommend supportive care at this time. Advised close follow up w PCP as needed. Appropriate Handouts provided.     Given the patient's reassuring vital signs and overall well appearance, the patient can be managed safely at home. Hydration advised. Patient advised to go to ED for any worsening or concerning symptoms.        Vinnie Langton, NP  10/05/20 1010

## 2020-10-06 LAB — COVID-19 NAAT (PCR): COVID-19 NAAT (PCR): NEGATIVE

## 2020-10-06 LAB — COVID-19 PCR

## 2020-10-07 LAB — STREP A CULTURE, THROAT: Group A Strep Throat Culture: 0

## 2020-10-09 ENCOUNTER — Ambulatory Visit: Payer: No Typology Code available for payment source | Admitting: Primary Care

## 2020-10-09 ENCOUNTER — Encounter: Payer: Self-pay | Admitting: Primary Care

## 2020-10-09 VITALS — BP 122/74 | HR 60 | Temp 97.5°F | Ht 72.0 in | Wt 212.0 lb

## 2020-10-09 DIAGNOSIS — Z125 Encounter for screening for malignant neoplasm of prostate: Secondary | ICD-10-CM

## 2020-10-09 DIAGNOSIS — G43909 Migraine, unspecified, not intractable, without status migrainosus: Secondary | ICD-10-CM

## 2020-10-09 DIAGNOSIS — I10 Essential (primary) hypertension: Secondary | ICD-10-CM

## 2020-10-09 NOTE — Progress Notes (Addendum)
Chief Complaint   Patient presents with    Hypertension     No current outpatient medications on file.     No current facility-administered medications for this visit.     HYPERTENSION MANAGEMENT:  Chronic and Stable. He lost 10 lbs since 11/23/19    BP 122/74    Pulse 60    Temp 36.4 C (97.5 F)    Ht 1.829 m (6')    Wt 96.2 kg (212 lb)    SpO2 99%    BMI 28.75 kg/m         PATIENT DENIES: [x]  Chest Pain. [x]  Palpitations. [x]  Shortness of Breath.    SYMPTOMS:  had a headache with recent URI-[facility] and was Covid/Strep negative. Has 2 migraines a year-triggered by stress.     HABITS:   --Sodium-   [] Yes  [x] No          --Exercise-  [x] Yes 2-3x week [employer]  [] No          --Smoking-  [] Yes  [x] No          --Caffeine Use- [x] Yes 2 diet Pepsi daily  [] No             HOME BLOOD PRESSURE: not taking    EXAM:    HEART: Regular rate and rhythm, no murmurs gallops or rubs.  LUNGS: Clear to auscultation bilaterally.       ASSESSMENT/PLAN:    1. HTN:  Presently well-controlled and at goal of less than 140/90.  Diet and exercise reviewed. On no meds. Check SMA-8  2. Migraines: infrequent-continue trigger modification. Ibuprofen as needed  3. Health Maintenance: he has had his Covid booster and Flu shot. Check PSA    RETURN: 6 months HTN/check Lipids

## 2020-10-16 ENCOUNTER — Other Ambulatory Visit
Admission: RE | Admit: 2020-10-16 | Discharge: 2020-10-16 | Disposition: A | Discharge status: Home / Self Care | Payer: No Typology Code available for payment source | Source: Ambulatory Visit | Attending: Primary Care | Admitting: Primary Care

## 2020-10-16 DIAGNOSIS — Z125 Encounter for screening for malignant neoplasm of prostate: Secondary | ICD-10-CM | POA: Insufficient documentation

## 2020-10-16 DIAGNOSIS — I10 Essential (primary) hypertension: Secondary | ICD-10-CM | POA: Insufficient documentation

## 2020-10-16 LAB — BASIC METABOLIC PANEL
Anion Gap: 14 (ref 7–16)
CO2: 23 mmol/L (ref 20–28)
Calcium: 9.5 mg/dL (ref 8.6–10.2)
Chloride: 99 mmol/L (ref 96–108)
Creatinine: 1.14 mg/dL (ref 0.67–1.17)
GFR,Black: 82 *
GFR,Caucasian: 71 *
Glucose: 84 mg/dL (ref 60–99)
Lab: 17 mg/dL (ref 6–20)
Potassium: 4.9 mmol/L (ref 3.3–5.1)
Sodium: 136 mmol/L (ref 133–145)

## 2020-10-16 LAB — PSA (EFF.4-2010): PSA (eff. 4-2010): 2.33 ng/mL (ref 0.00–4.00)

## 2021-02-06 ENCOUNTER — Telehealth: Payer: Self-pay | Admitting: Primary Care

## 2021-02-06 NOTE — Telephone Encounter (Signed)
Pt called c/o lower left side back pain for 1 week. Pt advised he tweaked his back cleaning the apartment he was staying in while out of tow.n Pt saw a Chiropractor there. Looking for ov or anti-inflammatory. Per PCP advise Aleve BID x 7 days with food, heat and theraworx foam. Office visit if no improvement.

## 2021-04-10 ENCOUNTER — Ambulatory Visit: Payer: No Typology Code available for payment source | Admitting: Primary Care

## 2022-05-15 ENCOUNTER — Encounter: Payer: Self-pay | Admitting: Primary Care

## 8387-06-22 DEATH — deceased
